# Patient Record
Sex: Female | Born: 1955
Health system: Southern US, Community
[De-identification: ages and names within clinical notes are randomized; demographics above are authoritative.]

## PROBLEM LIST (undated history)

## (undated) DIAGNOSIS — E785 Hyperlipidemia, unspecified: Secondary | ICD-10-CM

## (undated) DIAGNOSIS — K219 Gastro-esophageal reflux disease without esophagitis: Secondary | ICD-10-CM

## (undated) DIAGNOSIS — R002 Palpitations: Secondary | ICD-10-CM

## (undated) DIAGNOSIS — K829 Disease of gallbladder, unspecified: Secondary | ICD-10-CM

## (undated) DIAGNOSIS — Z86010 Personal history of colonic polyps: Secondary | ICD-10-CM

## (undated) DIAGNOSIS — M940 Chondrocostal junction syndrome [Tietze]: Secondary | ICD-10-CM

## (undated) DIAGNOSIS — I10 Essential (primary) hypertension: Secondary | ICD-10-CM

## (undated) DIAGNOSIS — Z9889 Other specified postprocedural states: Secondary | ICD-10-CM

## (undated) DIAGNOSIS — M2550 Pain in unspecified joint: Secondary | ICD-10-CM

## (undated) DIAGNOSIS — M722 Plantar fascial fibromatosis: Secondary | ICD-10-CM

## (undated) DIAGNOSIS — M549 Dorsalgia, unspecified: Secondary | ICD-10-CM

## (undated) DIAGNOSIS — M797 Fibromyalgia: Secondary | ICD-10-CM

## (undated) DIAGNOSIS — R600 Localized edema: Secondary | ICD-10-CM

## (undated) DIAGNOSIS — F419 Anxiety disorder, unspecified: Secondary | ICD-10-CM

## (undated) DIAGNOSIS — M5126 Other intervertebral disc displacement, lumbar region: Secondary | ICD-10-CM

## (undated) HISTORY — DX: Chondrocostal junction syndrome (tietze): M94.0

## (undated) HISTORY — DX: Disease of gallbladder, unspecified: K82.9

## (undated) HISTORY — PX: OTHER SURGICAL HISTORY: SHX169

## (undated) HISTORY — DX: Fibromyalgia: M79.7

## (undated) HISTORY — DX: Essential (primary) hypertension: I10

## (undated) HISTORY — DX: Anxiety disorder, unspecified: F41.9

## (undated) HISTORY — DX: Plantar fascial fibromatosis: M72.2

## (undated) HISTORY — DX: Palpitations: R00.2

## (undated) HISTORY — DX: Gastro-esophageal reflux disease without esophagitis: K21.9

## (undated) HISTORY — PX: CHOLECYSTECTOMY: SHX55

## (undated) HISTORY — DX: Hyperlipidemia, unspecified: E78.5

## (undated) HISTORY — DX: Pain in unspecified joint: M25.50

## (undated) HISTORY — DX: Localized edema: R60.0

## (undated) HISTORY — DX: Other intervertebral disc displacement, lumbar region: M51.26

## (undated) HISTORY — DX: Other specified postprocedural states: Z98.890

## (undated) HISTORY — DX: Personal history of colonic polyps: Z86.010

## (undated) HISTORY — DX: Dorsalgia, unspecified: M54.9

---

## 1986-06-18 HISTORY — PX: NASAL SINUS SURGERY: SHX719

## 1987-06-19 HISTORY — PX: CHOLECYSTECTOMY: SHX55

## 2001-06-18 HISTORY — PX: CERVICAL SPINE SURGERY: SHX589

## 2006-05-22 ENCOUNTER — Encounter: Admission: RE | Admit: 2006-05-22 | Discharge: 2006-05-22 | Payer: Self-pay | Admitting: Obstetrics and Gynecology

## 2009-02-17 ENCOUNTER — Encounter: Admission: RE | Admit: 2009-02-17 | Discharge: 2009-02-17 | Payer: Self-pay | Admitting: Obstetrics and Gynecology

## 2009-05-18 HISTORY — PX: LAPAROSCOPIC HYSTERECTOMY: SHX1926

## 2009-06-01 ENCOUNTER — Ambulatory Visit (HOSPITAL_COMMUNITY): Admission: RE | Admit: 2009-06-01 | Discharge: 2009-06-02 | Payer: Self-pay | Admitting: Obstetrics and Gynecology

## 2010-01-06 ENCOUNTER — Encounter: Admission: RE | Admit: 2010-01-06 | Discharge: 2010-01-06 | Payer: Self-pay | Admitting: Obstetrics and Gynecology

## 2010-06-23 ENCOUNTER — Encounter
Admission: RE | Admit: 2010-06-23 | Discharge: 2010-06-23 | Payer: Self-pay | Source: Home / Self Care | Attending: Obstetrics and Gynecology | Admitting: Obstetrics and Gynecology

## 2010-09-18 LAB — CBC
Platelets: 181 10*3/uL (ref 150–400)
RDW: 13.3 % (ref 11.5–15.5)

## 2010-09-19 LAB — BASIC METABOLIC PANEL
BUN: 14 mg/dL (ref 6–23)
Chloride: 107 mEq/L (ref 96–112)
GFR calc Af Amer: >60 mL/min (ref >60)
Glucose, Bld: 88 mg/dL (ref 70–99)
Potassium: 3.9 mEq/L (ref 3.5–5.1)
Sodium: 137 mEq/L (ref 135–145)

## 2010-09-19 LAB — CBC
HCT: 38.2 % (ref 36.0–46.0)
MCHC: 33.8 g/dL (ref 30.0–36.0)
MCV: 87.6 fL (ref 78.0–100.0)
Platelets: 203 10*3/uL (ref 150–400)
WBC: 5.9 10*3/uL (ref 4.0–10.5)

## 2011-05-04 ENCOUNTER — Other Ambulatory Visit: Payer: Self-pay | Admitting: Gastroenterology

## 2011-08-28 ENCOUNTER — Other Ambulatory Visit: Payer: Self-pay | Admitting: Obstetrics and Gynecology

## 2011-08-28 DIAGNOSIS — Z1231 Encounter for screening mammogram for malignant neoplasm of breast: Secondary | ICD-10-CM

## 2011-08-29 ENCOUNTER — Ambulatory Visit
Admission: RE | Admit: 2011-08-29 | Discharge: 2011-08-29 | Disposition: A | Discharge status: Home / Self Care | Payer: BC Managed Care – PPO | Source: Ambulatory Visit | Attending: Obstetrics and Gynecology | Admitting: Obstetrics and Gynecology

## 2011-08-29 DIAGNOSIS — Z1231 Encounter for screening mammogram for malignant neoplasm of breast: Secondary | ICD-10-CM

## 2012-08-25 ENCOUNTER — Other Ambulatory Visit: Payer: Self-pay

## 2012-08-25 DIAGNOSIS — Z1231 Encounter for screening mammogram for malignant neoplasm of breast: Secondary | ICD-10-CM

## 2012-09-18 ENCOUNTER — Ambulatory Visit
Admission: RE | Admit: 2012-09-18 | Discharge: 2012-09-18 | Disposition: A | Discharge status: Home / Self Care | Payer: BC Managed Care – PPO | Source: Ambulatory Visit

## 2012-09-18 ENCOUNTER — Ambulatory Visit: Payer: BC Managed Care – PPO

## 2012-09-18 DIAGNOSIS — Z1231 Encounter for screening mammogram for malignant neoplasm of breast: Secondary | ICD-10-CM

## 2012-09-25 ENCOUNTER — Ambulatory Visit: Payer: BC Managed Care – PPO

## 2013-10-27 ENCOUNTER — Other Ambulatory Visit: Payer: Self-pay

## 2013-10-27 DIAGNOSIS — Z1231 Encounter for screening mammogram for malignant neoplasm of breast: Secondary | ICD-10-CM

## 2013-10-28 ENCOUNTER — Ambulatory Visit
Admission: RE | Admit: 2013-10-28 | Discharge: 2013-10-28 | Disposition: A | Discharge status: Home / Self Care | Payer: BC Managed Care – PPO | Source: Ambulatory Visit

## 2013-10-28 ENCOUNTER — Encounter (INDEPENDENT_AMBULATORY_CARE_PROVIDER_SITE_OTHER): Payer: Self-pay

## 2013-10-28 DIAGNOSIS — Z1231 Encounter for screening mammogram for malignant neoplasm of breast: Secondary | ICD-10-CM

## 2017-01-31 DIAGNOSIS — K219 Gastro-esophageal reflux disease without esophagitis: Secondary | ICD-10-CM | POA: Insufficient documentation

## 2017-01-31 DIAGNOSIS — E785 Hyperlipidemia, unspecified: Secondary | ICD-10-CM | POA: Insufficient documentation

## 2017-01-31 DIAGNOSIS — I10 Essential (primary) hypertension: Secondary | ICD-10-CM | POA: Insufficient documentation

## 2017-03-06 ENCOUNTER — Encounter: Payer: Self-pay | Admitting: Gastroenterology

## 2017-03-13 ENCOUNTER — Encounter: Payer: Self-pay | Admitting: Gastroenterology

## 2017-03-25 ENCOUNTER — Ambulatory Visit: Payer: Self-pay | Admitting: Gastroenterology

## 2017-05-23 ENCOUNTER — Ambulatory Visit: Payer: Self-pay | Admitting: Gastroenterology

## 2017-08-07 ENCOUNTER — Ambulatory Visit: Payer: BLUE CROSS/BLUE SHIELD | Admitting: Gastroenterology

## 2017-08-07 ENCOUNTER — Encounter: Payer: Self-pay | Admitting: Gastroenterology

## 2017-08-07 VITALS — BP 130/76 | HR 88 | Ht 61.0 in | Wt 187.0 lb

## 2017-08-07 DIAGNOSIS — Z86010 Personal history of colonic polyps: Secondary | ICD-10-CM

## 2017-08-07 DIAGNOSIS — R194 Change in bowel habit: Secondary | ICD-10-CM

## 2017-08-07 DIAGNOSIS — R1013 Epigastric pain: Secondary | ICD-10-CM

## 2017-08-07 DIAGNOSIS — K219 Gastro-esophageal reflux disease without esophagitis: Secondary | ICD-10-CM | POA: Diagnosis not present

## 2017-08-07 MED ORDER — NA SULFATE-K SULFATE-MG SULF 17.5-3.13-1.6 GM/177ML PO SOLN: 1.0000 | Freq: Once | ORAL | 0 refills | Status: AC

## 2017-08-07 MED ORDER — METHYLCELLULOSE (LAXATIVE) 500 MG PO TABS: 1.0000 | ORAL_TABLET | Freq: Every day | ORAL | 0 refills | Status: DC

## 2017-08-07 MED ORDER — OMEPRAZOLE 40 MG PO CPDR: 40.0000 mg | DELAYED_RELEASE_CAPSULE | Freq: Two times a day (BID) | ORAL | 3 refills | Status: DC

## 2017-08-07 NOTE — Progress Notes (Signed)
HPI :  62 y/o female with a history of fibromyalgia, GERD, history of colon adenomas, self referred here for further evaluation of abdominal pain and GERD.  She endorses having reflux symptoms for a long time.  She states she has been on antacids for a long time for this.  She does not have much pyrosis but does endorse some regurgitation and epigastric discomfort are her reflux symptoms.  She states since August she has had some intermittent symptoms that bother her.  She was seen in urgent care at that time for an episode of chest pain, she was told this was not related to her heart.  She states over time she is developed some intermittent epigastric pain, she states this bothers her roughly 3-5 days/week.  The discomfort can last all day at times with some rare nocturnal symptoms.  She cannot think of any clear triggers which makes this worse.  Eating does not appear to cause symptoms. She also has some associated aching in her chest at times.  She denies any dysphasia or odynophagia.  She has rare nausea but no vomiting.  She states that when she drinks something cold that will make her feel better.  She states her pain can sometimes radiate into the middle of her back.  She states her symptoms are sometimes associated with her reflux symptoms.  She takes omeprazole 40 mg once a daily and reports using Tums a few times a week for breakthrough symptoms of reflux.  She states she has previously seen a cardiologist and had a stress test that was normal a few years ago.  She denies any exertional symptoms.  She otherwise reports having irregular bowel habits with episodes of loose stools followed by days of constipation.  She was previously on MiraLAX and then stopped.  She denies any blood in her stools.  She denies any weight loss.  Has a grandmother who had colon cancer but no other family history of colon cancer.  She had a colonoscopy in 2012 which showed one adenoma that was removed.  She has not  had a follow-up surveillance colonoscopy.  She thinks she has had an EGD several years ago.  She has a history of cholecystectomy around age 62.  Colonoscopy 05/04/2011 - sigmoid adenoma  Past Medical History:  Diagnosis Date  . Acid reflux   . Fibromyalgia   . H/O neck surgery   . Hx of colonic polyps   . Hyperlipidemia   . Hypertension      Past Surgical History:  Procedure Laterality Date  . CHOLECYSTECTOMY    . LAPAROSCOPIC HYSTERECTOMY  05/2009  . sinus surgeyr     Family History  Problem Relation Age of Onset  . Heart disease Mother   . Diabetes Mother   . Kidney disease Mother   . Leukemia Mother        CLL  . Heart attack Father        AGE 21  . Colon cancer Maternal Grandmother   . AAA (abdominal aortic aneurysm) Neg Hx    Social History   Tobacco Use  . Smoking status: Former Games developermoker  . Smokeless tobacco: Never Used  Substance Use Topics  . Alcohol use: No    Frequency: Never  . Drug use: No   Current Outpatient Medications  Medication Sig Dispense Refill  . amLODipine-benazepril (LOTREL) 10-20 MG capsule Take 1 capsule by mouth daily.    . chlorhexidine (PERIDEX) 0.12 % solution Use as directed 15 mLs  in the mouth or throat 2 (two) times daily.    Marland Kitchen estradiol (ESTRACE) 1 MG tablet Take 1 mg by mouth daily.    Marland Kitchen lovastatin (MEVACOR) 40 MG tablet Take 40 mg by mouth at bedtime.    Marland Kitchen omeprazole (PRILOSEC) 40 MG capsule Take 40 mg by mouth daily.     No current facility-administered medications for this visit.    Allergies  Allergen Reactions  . Hydrocodone Nausea Only  . Ultram [Tramadol Hcl] Nausea Only    Labs from primary care reviewed - done 01/31/2017 - normal LFTs, lipase, renal function  Review of Systems: All systems reviewed and negative except where noted in HPI.   Physical Exam: BP 130/76   Pulse 88   Ht 5\' 1"  (1.549 m)   Wt 187 lb (84.8 kg)   BMI 35.33 kg/m  Constitutional: Pleasant,well-developed, female in no acute  distress. HEENT: Normocephalic and atraumatic. Conjunctivae are normal. No scleral icterus. Neck supple.  Cardiovascular: Normal rate, regular rhythm.  Pulmonary/chest: Effort normal and breath sounds normal. No wheezing, rales or rhonchi. Abdominal: Soft, nondistended, nontender. There are no masses palpable. No hepatomegaly. Extremities: no edema Lymphadenopathy: No cervical adenopathy noted. Neurological: Alert and oriented to person place and time. Skin: Skin is warm and dry. No rashes noted. Psychiatric: Normal mood and affect. Behavior is normal.   ASSESSMENT AND PLAN: 62 year old female with history of fibromyalgia and reflux, here for new patient assessment for epigastric pain and altered bowel habits.  Epigastric pain / GERD / atypical chest pain - possible her recent pains are related to reflux, as it appears to be associated with her worsening reflux symptoms, she is using TUMS multiple days per week for breakthrough.  I am recommending we increase her omeprazole to twice daily dosing for a few weeks to see if this helps.  Otherwise recommending an upper endoscopy to further evaluate these symptoms and screen for BE.  I discussed the risks and benefits of endoscopy with her and she wanted to proceed, although she is awaiting for preapproval from her insurance to see how much she will have to pay out of pocket for this.  Cardiac etiology seems less likely, she states she has had a recent negative workup.  If EGD is unremarkable and trial of higher dose omeprazole does not improve her symptoms of epigastric pain, recommend CT scan of the abdomen and pelvis. She agreed.  Altered bowel habits / history of colon adenoma - alternating constipation and diarrhea, recommend a trial of Citrucel once daily to help normalize her bowels.  She has a history of colon adenoma and is overdue for surveillance colonoscopy.  I am recommending a colonoscopy for surveillance of her polyps.  I discussed the  risks and benefits of this and she did want to proceed, however she again is concerned about cost and needs preapproval from insurance.  Ileene Patrick, MD [facility] Pager [phone]

## 2017-08-07 NOTE — Patient Instructions (Addendum)
If you are age 61 or older, your body mass index should be between 23-30. Your Body mass index is 35.33 kg/m. If this is out of the aforementioned range listed, please consider follow up with your Primary Care Provider.  If you are age 61 or younger, your body mass index should be between 19-25. Your Body mass index is 35.33 kg/m. If this is out of the aformentioned range listed, please consider follow up with your Primary Care Provider.   You have been scheduled for an endoscopy and colonoscopy. Please follow the written instructions given to you at your visit today. Please pick up your prep supplies at the pharmacy within the next 1-3 days. If you use inhalers (even only as needed), please bring them with you on the day of your procedure. Your physician has requested that you go to www.startemmi.com and enter the access code given to you at your visit today. This web site gives a general overview about your procedure. However, you should still follow specific instructions given to you by our office regarding your preparation for the procedure.  Please purchase Citrucel over the counter and take daily.  Please increase your Omeprazole to twice a day.  We have sent the following medications to your pharmacy for you to pick up at your convenience: Omeprazole, 40mg   Thank you for entrusting me with your care and for choosing [facility], Dr. Ileene PatrickSteven Armbruster

## 2017-08-13 ENCOUNTER — Telehealth: Payer: Self-pay | Admitting: Gastroenterology

## 2017-08-13 NOTE — Telephone Encounter (Signed)
Patient states that since doubling the omeprazole she has started waking up in the middle of the night with her "heart racing", she does say that the "knot" in her upper stomach has felt better. She also has an appointment with her PCP for tomorrow to discuss anxiety issues. Please advise.

## 2017-08-13 NOTE — Telephone Encounter (Signed)
Palpitations would be an unusual side effect from omeprazole, and sounds like it has helped her symptoms which could be due to reflux.   That being said If she thinks there is a relationship between her higher dosing and these symptoms, she can reduce back to her normal omeprazole dosing and see if the symptoms go away. She should mention this to her PCP tomorrow as well. Thanks

## 2017-08-13 NOTE — Telephone Encounter (Signed)
Spoke to patient, she will reduce the dosage back down and let her PCP know. Told her to let us know if not better.

## 2017-08-15 ENCOUNTER — Telehealth: Payer: Self-pay

## 2017-08-15 NOTE — Telephone Encounter (Signed)
Medical records sent [facility] [place] [facility]  Referring MD: Farris HasAaron Morrow, MD Phone #: [phone]  Notes sent to scheduling

## 2017-08-19 ENCOUNTER — Encounter: Payer: Self-pay | Admitting: Cardiology

## 2017-08-19 ENCOUNTER — Ambulatory Visit: Payer: BLUE CROSS/BLUE SHIELD | Admitting: Cardiology

## 2017-08-19 VITALS — BP 118/70 | HR 84 | Ht 61.0 in | Wt 179.8 lb

## 2017-08-19 DIAGNOSIS — I10 Essential (primary) hypertension: Secondary | ICD-10-CM | POA: Diagnosis not present

## 2017-08-19 DIAGNOSIS — E782 Mixed hyperlipidemia: Secondary | ICD-10-CM

## 2017-08-19 DIAGNOSIS — K219 Gastro-esophageal reflux disease without esophagitis: Secondary | ICD-10-CM | POA: Diagnosis not present

## 2017-08-19 DIAGNOSIS — R0789 Other chest pain: Secondary | ICD-10-CM | POA: Diagnosis not present

## 2017-08-19 NOTE — Patient Instructions (Signed)
Medication Instructions:  Your physician recommends that you continue on your current medications as directed. Please refer to the Current Medication list given to you today.  Labwork: None  Testing/Procedures: Your physician has requested that you have a stress echocardiogram. For further information please visit www.[email or web address]. Please follow instruction sheet as given.  Follow-Up: Your physician recommends that you schedule a follow-up appointment in: 3 months  Any Other Special Instructions Will Be Listed Below (If Applicable).     If you need a refill on your cardiac medications before your next appointment, please call your pharmacy.   [facility]  Leeana Creer A, RN, BSN  

## 2017-08-19 NOTE — Progress Notes (Signed)
Cardiology Office Note:    Date:  08/19/2017   ID:  Hannah Wiggins, DOB 04/07/1956, MRN [id number]  PCP:  Farris HasMorrow, Aaron, MD  Cardiologist:  Garwin Brothersajan R Zowie Lundahl, MD   Referring MD: Farris HasMorrow, Aaron, MD    ASSESSMENT:    1. Atypical chest pain   2. Benign hypertension   3. Gastroesophageal reflux disease, esophagitis presence not specified   4. Mixed hyperlipidemia    PLAN:    In order of problems listed above:  1. Primary prevention stressed with the patient.  Importance of compliance with diet and medications stressed.  Her blood pressure is stable and lipids are managed by her primary care doctor.  Her lipids are elevated in the past and diet was discussed with her.  Questions were answered to her satisfaction. 2. Chest pain appears atypical.  However in view of multiple risk factors and to reassure her I will obtain a exercise stress echo.  She also has significant gastroesophageal reflux disease and is managed by her primary care doctor and gastroenterologist.  She knows to go to the nearest emergency room for any concerning symptoms.   Medication Adjustments/Labs and Tests Ordered: Current medicines are reviewed at length with the patient today.  Concerns regarding medicines are outlined above.  No orders of the defined types were placed in this encounter.  No orders of the defined types were placed in this encounter.    History of Present Illness:    Hannah Wiggins is a 62 y.o. female who is being seen today for the evaluation of chest pain at the request of Farris HasMorrow, Aaron, MD.  Patient is a pleasant 62 year old female.  She has past medical history of essential hypertension and dyslipidemia.  She mentions to me that over the past several weeks she does have experiencing substernal chest tightness.  No orthopnea or PND.  This does not radiate to any part of the body.  She has been getting a sedentary lifestyle for the past couple of months.  She is married and is sexually  active.  Sexual activity does not bring about the symptoms.  At the time of my evaluation, the patient is alert awake oriented and in no distress.  Past Medical History:  Diagnosis Date  . Acid reflux   . Fibromyalgia   . H/O neck surgery   . Hx of colonic polyps   . Hyperlipidemia   . Hypertension     Past Surgical History:  Procedure Laterality Date  . CHOLECYSTECTOMY    . LAPAROSCOPIC HYSTERECTOMY  05/2009  . sinus surgeyr      Current Medications: Current Meds  Medication Sig  . amLODipine-benazepril (LOTREL) 10-20 MG capsule Take 1 capsule by mouth daily.  . chlorhexidine (PERIDEX) 0.12 % solution Use as directed 15 mLs in the mouth or throat 2 (two) times daily.  . cyclobenzaprine (FLEXERIL) 10 MG tablet   . estradiol (ESTRACE) 1 MG tablet Take 1 mg by mouth daily.  Marland Kitchen. lovastatin (MEVACOR) 40 MG tablet Take 40 mg by mouth at bedtime.  . Methylcellulose, Laxative, (CITRUCEL) 500 MG TABS Take 1 tablet (500 mg total) by mouth daily.  Marland Kitchen. omeprazole (PRILOSEC) 20 MG capsule   . omeprazole (PRILOSEC) 40 MG capsule Take 1 capsule (40 mg total) by mouth 2 (two) times daily.     Allergies:   Hydrocodone and Ultram [tramadol hcl]   Social History   Socioeconomic History  . Marital status: Married    Spouse name: None  . Number of  children: 1  . Years of education: None  . Highest education level: None  Social Needs  . Financial resource strain: None  . Food insecurity - worry: None  . Food insecurity - inability: None  . Transportation needs - medical: None  . Transportation needs - non-medical: None  Occupational History  . None  Tobacco Use  . Smoking status: Former Games developer  . Smokeless tobacco: Never Used  Substance and Sexual Activity  . Alcohol use: No    Frequency: Never  . Drug use: No  . Sexual activity: None  Other Topics Concern  . None  Social History Narrative  . None     Family History: The patient's family history includes Colon cancer in her  maternal grandmother; Diabetes in her mother; Heart attack in her father; Heart disease in her mother; Kidney disease in her mother; Leukemia in her mother. There is no history of AAA (abdominal aortic aneurysm).  ROS:   Please see the history of present illness.    All other systems reviewed and are negative.  EKGs/Labs/Other Studies Reviewed:    The following studies were reviewed today: I reviewed EKG which reveals sinus rhythm and nonspecific ST-T changes.   Recent Labs: No results found for requested labs within last 8760 hours.  Recent Lipid Panel No results found for: CHOL, TRIG, HDL, CHOLHDL, VLDL, LDLCALC, LDLDIRECT  Physical Exam:    VS:  BP 118/70 (BP Location: Left Arm, Patient Position: Sitting, Cuff Size: Normal)   Pulse 84   Ht 5\' 1"  (1.549 m)   Wt 179 lb 12.8 oz (81.6 kg)   SpO2 99%   BMI 33.97 kg/m     Wt Readings from Last 3 Encounters:  08/19/17 179 lb 12.8 oz (81.6 kg)  08/07/17 187 lb (84.8 kg)     GEN: Patient is in no acute distress HEENT: Normal NECK: No JVD; No carotid bruits LYMPHATICS: No lymphadenopathy CARDIAC: S1 S2 regular, 2/6 systolic murmur at the apex. RESPIRATORY:  Clear to auscultation without rales, wheezing or rhonchi  ABDOMEN: Soft, non-tender, non-distended MUSCULOSKELETAL:  No edema; No deformity  SKIN: Warm and dry NEUROLOGIC:  Alert and oriented x 3 PSYCHIATRIC:  Normal affect    Signed, Garwin Brothers, MD  08/19/2017 10:20 AM    [facility]

## 2017-08-30 ENCOUNTER — Telehealth (HOSPITAL_COMMUNITY): Payer: Self-pay | Admitting: *Deleted

## 2017-08-30 NOTE — Telephone Encounter (Signed)
Left message on voicemail per DPR in reference to upcoming appointment scheduled on 09/04/17 at 2:30 with detailed instructions given per Stress Test Requisition Sheet for the test. LM to arrive 30 minutes early, and that it is imperative to arrive on time for appointment to keep from having the test rescheduled. If you need to cancel or reschedule your appointment, please call the office within 24 hours of your appointment. Failure to do so may result in a cancellation of your appointment, and a $50 no show fee. Phone number given for call back for any questions. Daneil DolinSharon S Brooks

## 2017-09-02 ENCOUNTER — Encounter: Payer: Self-pay | Admitting: Gastroenterology

## 2017-09-04 ENCOUNTER — Ambulatory Visit (HOSPITAL_BASED_OUTPATIENT_CLINIC_OR_DEPARTMENT_OTHER): Payer: BLUE CROSS/BLUE SHIELD

## 2017-09-04 ENCOUNTER — Ambulatory Visit (HOSPITAL_COMMUNITY): Payer: BLUE CROSS/BLUE SHIELD | Attending: Cardiology

## 2017-09-04 DIAGNOSIS — R5383 Other fatigue: Secondary | ICD-10-CM | POA: Diagnosis not present

## 2017-09-04 DIAGNOSIS — I10 Essential (primary) hypertension: Secondary | ICD-10-CM | POA: Insufficient documentation

## 2017-09-04 DIAGNOSIS — R0789 Other chest pain: Secondary | ICD-10-CM

## 2017-09-04 DIAGNOSIS — E785 Hyperlipidemia, unspecified: Secondary | ICD-10-CM | POA: Diagnosis not present

## 2017-09-06 ENCOUNTER — Telehealth: Payer: Self-pay | Admitting: Cardiology

## 2017-09-06 NOTE — Telephone Encounter (Signed)
Patient would like results please.

## 2017-09-06 NOTE — Telephone Encounter (Signed)
Informed patient of results

## 2017-09-13 ENCOUNTER — Other Ambulatory Visit (HOSPITAL_BASED_OUTPATIENT_CLINIC_OR_DEPARTMENT_OTHER): Payer: BLUE CROSS/BLUE SHIELD

## 2017-09-16 ENCOUNTER — Encounter: Payer: BLUE CROSS/BLUE SHIELD | Admitting: Gastroenterology

## 2017-09-23 ENCOUNTER — Ambulatory Visit: Payer: BLUE CROSS/BLUE SHIELD | Admitting: Cardiology

## 2018-02-04 ENCOUNTER — Other Ambulatory Visit: Payer: Self-pay | Admitting: Family Medicine

## 2018-02-04 DIAGNOSIS — M5410 Radiculopathy, site unspecified: Secondary | ICD-10-CM

## 2018-03-10 ENCOUNTER — Ambulatory Visit: Payer: BLUE CROSS/BLUE SHIELD | Admitting: Sports Medicine

## 2018-03-10 ENCOUNTER — Encounter: Payer: Self-pay | Admitting: Sports Medicine

## 2018-03-10 VITALS — BP 120/73 | Ht 61.0 in | Wt 196.0 lb

## 2018-03-10 DIAGNOSIS — M722 Plantar fascial fibromatosis: Secondary | ICD-10-CM

## 2018-03-10 NOTE — Progress Notes (Signed)
   Subjective:    Patient ID: Hannah SealKaren W Belter, female    DOB: Oct 11, 1955, 62 y.o.   MRN: [id number]  HPI chief complaint: Right heel pain  Very pleasant 62 year old female comes in today complaining of 3 months of right heel pain.  Symptoms began shortly after she had to do an exercise stress test.  Pain was initially along the medial calcaneus but it is now more diffuse.  It is worse with first step but it can persist throughout the day as well.  She takes 800 mg of ibuprofen as needed which does seem to be helpful.  She has tried some soft off-the-shelf orthotics which have not been very helpful.  She does have a history of plantar fasciitis treated several years ago with custom orthotics which were curative.  She has been icing daily but it is uncomfortable.  She is also been trying some intermittent stretching but is not doing anything scheduled.  No numbness or tingling.  Past medical history reviewed Medications reviewed She is reviewed    Review of Systems    As above Objective:   Physical Exam  Well-developed, well-nourished.  No acute distress.  Awake alert and oriented x3.  Vital signs reviewed.  Right heel: Tenderness to palpation at the origin of the plantar fascia.  Negative calcaneal squeeze.  No soft tissue swelling.  Moderate pes planus with standing.  Good pulses.  Walking with an obvious limp.  MSK ultrasound of the right calcaneus was performed.  Plantar fascia is thickened with hypoechoic changes.  No obvious stress fracture.  No calcaneal spurring.  Findings consistent with plantar fasciitis.      Assessment & Plan:   Right heel pain secondary to plantar fasciitis  Custom orthotics were created for the patient today.  In addition, she may continue with her 800 mg of ibuprofen as needed but she is cautioned about stomach upset as she does have a history of reflux disease.  Also provided her information regarding a [place] sock to wear at night.  She will  start daily plantar fascial stretching and calf stretching. D/C daily icing since it is painful.  We will also try an arch strap.  Follow-up with me as needed.  If symptoms persist or worsen, consider x-ray.  Total time spent with the patient was 40 minutes with greater than 50% of the time spent in face-to-face consultation discussing her diagnosis, treatment, and fitting her with custom orthotics.  Patient found her orthotics to be comfortable prior to leaving the office.  Patient was fitted for a : standard, cushioned, semi-rigid orthotic. The orthotic was heated and afterward the patient stood on the orthotic blank positioned on the orthotic stand. The patient was positioned in subtalar neutral position and 10 degrees of ankle dorsiflexion in a weight bearing stance. After completion of molding, a stable base was applied to the orthotic blank. The blank was ground to a stable position for weight bearing. Size: 7 Base: Blue EVA Posting: none Additional orthotic padding: none

## 2018-03-26 ENCOUNTER — Ambulatory Visit: Payer: BLUE CROSS/BLUE SHIELD | Admitting: Sports Medicine

## 2018-04-15 ENCOUNTER — Telehealth: Payer: Self-pay

## 2018-04-15 DIAGNOSIS — M722 Plantar fascial fibromatosis: Secondary | ICD-10-CM

## 2018-04-15 NOTE — Telephone Encounter (Signed)
Per Dr. Margaretha Sheffield, right calcaneal x-ray order placed. Pt understands and will go get the test done at her earliest convenience.

## 2018-04-16 ENCOUNTER — Ambulatory Visit
Admission: RE | Admit: 2018-04-16 | Discharge: 2018-04-16 | Disposition: A | Discharge status: Home / Self Care | Payer: BLUE CROSS/BLUE SHIELD | Source: Ambulatory Visit | Attending: Sports Medicine | Admitting: Sports Medicine

## 2018-04-16 DIAGNOSIS — M722 Plantar fascial fibromatosis: Secondary | ICD-10-CM

## 2018-04-21 ENCOUNTER — Telehealth: Payer: Self-pay

## 2018-04-21 DIAGNOSIS — M722 Plantar fascial fibromatosis: Secondary | ICD-10-CM

## 2018-04-21 NOTE — Telephone Encounter (Signed)
Pt agrees with plan. Referral placed to Dr. Logan Bores.

## 2018-04-30 ENCOUNTER — Ambulatory Visit: Payer: Self-pay | Admitting: Podiatry

## 2019-08-11 ENCOUNTER — Ambulatory Visit (INDEPENDENT_AMBULATORY_CARE_PROVIDER_SITE_OTHER): Payer: BLUE CROSS/BLUE SHIELD | Admitting: Family Medicine

## 2019-08-12 ENCOUNTER — Ambulatory Visit (INDEPENDENT_AMBULATORY_CARE_PROVIDER_SITE_OTHER): Payer: 59 | Admitting: Bariatrics

## 2019-08-12 ENCOUNTER — Other Ambulatory Visit: Payer: Self-pay

## 2019-08-12 ENCOUNTER — Encounter (INDEPENDENT_AMBULATORY_CARE_PROVIDER_SITE_OTHER): Payer: Self-pay | Admitting: Bariatrics

## 2019-08-12 VITALS — BP 111/75 | HR 71 | Temp 98.4°F | Ht 61.0 in | Wt 211.0 lb

## 2019-08-12 DIAGNOSIS — Z1331 Encounter for screening for depression: Secondary | ICD-10-CM

## 2019-08-12 DIAGNOSIS — E7849 Other hyperlipidemia: Secondary | ICD-10-CM | POA: Diagnosis not present

## 2019-08-12 DIAGNOSIS — Z9189 Other specified personal risk factors, not elsewhere classified: Secondary | ICD-10-CM | POA: Diagnosis not present

## 2019-08-12 DIAGNOSIS — R5383 Other fatigue: Secondary | ICD-10-CM | POA: Diagnosis not present

## 2019-08-12 DIAGNOSIS — E66812 Obesity, class 2: Secondary | ICD-10-CM

## 2019-08-12 DIAGNOSIS — I10 Essential (primary) hypertension: Secondary | ICD-10-CM

## 2019-08-12 DIAGNOSIS — E559 Vitamin D deficiency, unspecified: Secondary | ICD-10-CM

## 2019-08-12 DIAGNOSIS — Z0289 Encounter for other administrative examinations: Secondary | ICD-10-CM

## 2019-08-12 DIAGNOSIS — R0602 Shortness of breath: Secondary | ICD-10-CM | POA: Diagnosis not present

## 2019-08-12 DIAGNOSIS — Z6841 Body Mass Index (BMI) 40.0 and over, adult: Secondary | ICD-10-CM

## 2019-08-12 DIAGNOSIS — K219 Gastro-esophageal reflux disease without esophagitis: Secondary | ICD-10-CM

## 2019-08-12 DIAGNOSIS — R7309 Other abnormal glucose: Secondary | ICD-10-CM

## 2019-08-12 NOTE — Progress Notes (Signed)
Chief Complaint:   OBESITY Hannah Wiggins (MR# [id number]) is a 64 y.o. female who presents for evaluation and treatment of obesity and related comorbidities. Current BMI is Body mass index is 39.87 kg/m.Hannah Wiggins Hannah Wiggins has been struggling with her weight for many years and has been unsuccessful in either losing weight, maintaining weight loss, or reaching her healthy weight goal.  Hannah Wiggins is currently in the action stage of change and ready to dedicate time achieving and maintaining a healthier weight. Hannah Wiggins is interested in becoming our patient and working on intensive lifestyle modifications including (but not limited to) diet and exercise for weight loss.  Hannah Wiggins does not like to cook due to cleanup. She craves breads and sweets. She does not like berries.  Hannah Wiggins's habits were reviewed today and are as follows: Her family eats meals together, she thinks her family will eat healthier with her, her desired weight loss is 61 lbs, she started gaining weight at 85-84 years old, her heaviest weight ever was 211 pounds, she craves breads and sweets, she snacks frequently in the evenings, she frequently makes poor food choices, she frequently eats larger portions than normal, she has binge eating behaviors and she struggles with emotional eating.  Depression Screen Hannah Wiggins's Food and Mood (modified PHQ-9) score was 10.  Depression screen PHQ 2/9 08/12/2019  Decreased Interest 1  Down, Depressed, Hopeless 1  PHQ - 2 Score 2  Altered sleeping 1  Tired, decreased energy 3  Change in appetite 2  Feeling bad or failure about yourself  2  Trouble concentrating 0  Moving slowly or fidgety/restless 0  Suicidal thoughts 0  PHQ-9 Score 10  Difficult doing work/chores Not difficult at all   Subjective:   Other fatigue.  Hannah Wiggins denies daytime somnolence and admits to waking up still tired. Hannah Wiggins generally gets 8-9 hours of sleep per night, and states that she does not sleep well most nights. Snoring is  present. Apneic episodes are not present. Epworth Sleepiness Score is 3.  Shortness of breath on exertion. Hannah Wiggins notes increasing shortness of breath with certain activities and seems to be worsening over time with weight gain. She notes getting out of breath sooner with activity than she used to. This has gotten worse recently. Hannah Wiggins denies shortness of breath at rest or orthopnea.  Essential hypertension. Hannah Wiggins is taking Lotrel. Blood pressure is well controlled.  BP Readings from Last 3 Encounters:  08/12/19 111/75  03/10/18 120/73  08/19/17 118/70   Lab Results  Component Value Date   CREATININE 0.67 05/31/2009   Other hyperlipidemia. Hannah Wiggins is taking lovastatin. She denies muscle aches related to medication.   No results found for: CHOL, HDL, LDLCALC, LDLDIRECT, TRIG, CHOLHDL No results found for: ALT, AST, GGT, ALKPHOS, BILITOT The 10-year ASCVD risk score Denman George DC Jr., et al., 2013) is: 3.8%   Values used to calculate the score:     Age: 39 years     Sex: Female     Is Non-Hispanic African American: No     Diabetic: No     Tobacco smoker: No     Systolic Blood Pressure: 111 mmHg     Is BP treated: Yes     HDL Cholesterol: 56 MG/DL     Total Cholesterol: 181 MG/DL  Gastroesophageal reflux disease, unspecified whether esophagitis present. Lachrista is taking Prilosec. GERD is not well controlled.  Vitamin D deficiency. Hannah Wiggins is taking Vitamin D 4,000 IU. Last Vitamin D level reportedly 29.  Elevated glucose.  Hannah Wiggins reports increased appetite. Last glucose reportedly 102.  Depression screening. [name] had a moderately positive depression screen with a PHQ-9 score of 10.  At risk for heart disease. Hannah Wiggins is at a higher than average risk for cardiovascular disease due to hypertension and obesity.  Assessment/Plan:   Other fatigue. Hannah Wiggins does feel that her weight is causing her energy to be lower than it should be. Fatigue may be related to obesity, depression or many other  causes. Labs will be ordered, and in the meanwhile, Hannah Wiggins will focus on self care including making healthy food choices, increasing physical activity and focusing on stress reduction.  EKG 12-Lead  Shortness of breath on exertion. Hannah Wiggins does feel that she gets out of breath more easily that she used to when she exercises. Hannah Wiggins's shortness of breath appears to be obesity related and exercise induced. She has agreed to work on weight loss and gradually increase exercise to treat her exercise induced shortness of breath. Will continue to monitor closely.  Essential hypertension. Hannah Wiggins is working on healthy weight loss and exercise to improve blood pressure control. We will watch for signs of hypotension as she continues her lifestyle modifications. She will continue medications as directed.  Other hyperlipidemia. Cardiovascular risk and specific lipid/LDL goals reviewed.  We discussed several lifestyle modifications today and Hannah Wiggins will continue to work on diet, exercise and weight loss efforts. Orders and follow up as documented in patient record. She will continue medications as directed.  Counseling Intensive lifestyle modifications are the first line treatment for this issue. . Dietary changes: Increase soluble fiber. Decrease simple carbohydrates. . Exercise changes: Moderate to vigorous-intensity aerobic activity 150 minutes per week if tolerated. . Lipid-lowering medications: see documented in medical record.  Gastroesophageal reflux disease, unspecified whether esophagitis present. Intensive lifestyle modifications are the first line treatment for this issue. We discussed several lifestyle modifications today and she will continue to work on diet, exercise and weight loss efforts. Orders and follow up as documented in patient record. Hannah Wiggins will continue medications as directed and will avoid triggers.  Counseling . If a person has gastroesophageal reflux disease (GERD), food and stomach acid  move back up into the esophagus and cause symptoms or problems such as damage to the esophagus. . Anti-reflux measures include: raising the head of the bed, avoiding tight clothing or belts, avoiding eating late at night, not lying down shortly after mealtime, and achieving weight loss. . Avoid ASA, NSAID's, caffeine, alcohol, and tobacco.  . OTC Pepcid and/or Tums are often very helpful for as needed use.  Hannah Wiggins However, for persisting chronic or daily symptoms, stronger medications like Omeprazole may be needed. . You may need to avoid foods and drinks such as: ? Coffee and tea (with or without caffeine). ? Drinks that contain alcohol. ? Energy drinks and sports drinks. ? Bubbly (carbonated) drinks or sodas. ? Chocolate and cocoa. ? Peppermint and mint flavorings. ? Garlic and onions. ? Horseradish. ? Spicy and acidic foods. These include peppers, chili powder, curry powder, vinegar, hot sauces, and BBQ sauce. ? Citrus fruit juices and citrus fruits, such as oranges, lemons, and limes. ? Tomato-based foods. These include red sauce, chili, salsa, and pizza with red sauce. ? Fried and fatty foods. These include donuts, french fries, potato chips, and high-fat dressings. ? High-fat meats. These include hot dogs, rib eye steak, sausage, ham, and bacon.  Vitamin D deficiency. Low Vitamin D level contributes to fatigue and are associated with obesity, breast, and colon cancer.  VITAMIN D 25 Hydroxy (Vit-D Deficiency, Fractures) level ordered.  Elevated glucose. Fasting labs will be obtained and results with be discussed with Clydie Braun in 2 weeks at her follow up visit. In the meanwhile Laquitta was started on a lower simple carbohydrate diet and will work on weight loss efforts. Hemoglobin A1c, Insulin, random labs ordered.  Depression screening. Jenniferann had a positive depression screening. Depression is commonly associated with obesity and often results in emotional eating behaviors. We will monitor this  closely and work on CBT to help improve the non-hunger eating patterns. Referral to Psychology may be required if no improvement is seen as she continues in our clinic.  At risk for heart disease. Stefanie was given approximately 15 minutes of coronary artery disease prevention counseling today. She is 64 y.o. female and has risk factors for heart disease including obesity. We discussed intensive lifestyle modifications today with an emphasis on specific weight loss instructions and strategies.   Repetitive spaced learning was employed today to elicit superior memory formation and behavioral change.  Class 3 severe obesity with serious comorbidity and body mass index (BMI) of 40.0 to 44.9 in adult, unspecified obesity type (HCC).  Briteny is currently in the action stage of change and her goal is to continue with weight loss efforts. I recommend Edell begin the structured treatment plan as follows:  She has agreed to the Category 1 Plan + 100 calories.  She will work on meal planning, intentional eating, and will work on portion size.  We reviewed labs from 05/09/2019 from [facility] with the patient including Vitamin D, chemistries, and CBC.  Exercise goals: All adults should avoid inactivity. Some physical activity is better than none, and adults who participate in any amount of physical activity gain some health benefits.   Behavioral modification strategies: increasing lean protein intake, decreasing simple carbohydrates, increasing vegetables, increasing water intake, decreasing eating out, no skipping meals, meal planning and cooking strategies, keeping healthy foods in the home, better snacking choices, emotional eating strategies and planning for success.  She was informed of the importance of frequent follow-up visits to maximize her success with intensive lifestyle modifications for her multiple health conditions. She was informed we would discuss her lab results at her next visit unless  there is a critical issue that needs to be addressed sooner. Jeannett agreed to keep her next visit at the agreed upon time to discuss these results.  Objective:   Blood pressure 111/75, pulse 71, temperature 98.4 F (36.9 C), temperature source Oral, height 5\' 1"  (1.549 m), weight 211 lb (95.7 kg), SpO2 95 %. Body mass index is 39.87 kg/m.  EKG: Sinus  Rhythm with a rate of 73 BPM. Poor R-wave progression - may be secondary to pulmonary disease - consider old anterior infarct. Low voltage - possible pulmonary disease. Abnormal.  Indirect Calorimeter completed today shows a VO2 of 212 and a REE of 1477.  Her calculated basal metabolic rate is thus her basal metabolic rate is better than expected.  General: Cooperative, alert, well developed, in no acute distress. HEENT: Conjunctivae and lids unremarkable. Cardiovascular: Regular rhythm.  Lungs: Normal work of breathing. Neurologic: No focal deficits.   Lab Results  Component Value Date   CREATININE 0.67 05/31/2009   BUN 14 05/31/2009   NA 137 05/31/2009   K 3.9 05/31/2009   CL 107 05/31/2009   CO2 25 05/31/2009   No results found for: ALT, AST, GGT, ALKPHOS, BILITOT No results found for: HGBA1C No results  found for: INSULIN No results found for: TSH No results found for: CHOL, HDL, LDLCALC, LDLDIRECT, TRIG, CHOLHDL Lab Results  Component Value Date   WBC 12.6 (H) 06/02/2009   HGB 10.9 (L) 06/02/2009   HCT 31.9 (L) 06/02/2009   MCV 88.0 06/02/2009   PLT 181 06/02/2009   No results found for: IRON, TIBC, FERRITIN  Attestation Statements:   Reviewed by clinician on day of visit: allergies, medications, problem list, medical history, surgical history, family history, social history, and previous encounter notes.  Migdalia Dk, am acting as Location manager for [employer], DO   I have reviewed the above documentation for accuracy and completeness, and I agree with the above. Jearld Lesch, DO

## 2019-08-13 ENCOUNTER — Encounter (INDEPENDENT_AMBULATORY_CARE_PROVIDER_SITE_OTHER): Payer: Self-pay | Admitting: Bariatrics

## 2019-08-13 DIAGNOSIS — E559 Vitamin D deficiency, unspecified: Secondary | ICD-10-CM | POA: Insufficient documentation

## 2019-08-13 LAB — INSULIN, RANDOM: INSULIN: 22.1 u[IU]/mL (ref 2.6–24.9)

## 2019-08-13 LAB — VITAMIN D 25 HYDROXY (VIT D DEFICIENCY, FRACTURES): Vit D, 25-Hydroxy: 27.2 ng/mL — ABNORMAL LOW (ref 30.0–100.0)

## 2019-08-13 LAB — HEMOGLOBIN A1C
Est. average glucose Bld gHb Est-mCnc: 108 mg/dL
Hgb A1c MFr Bld: 5.4 % (ref 4.8–5.6)

## 2019-08-26 ENCOUNTER — Other Ambulatory Visit: Payer: Self-pay

## 2019-08-26 ENCOUNTER — Encounter (INDEPENDENT_AMBULATORY_CARE_PROVIDER_SITE_OTHER): Payer: Self-pay | Admitting: Bariatrics

## 2019-08-26 ENCOUNTER — Ambulatory Visit (INDEPENDENT_AMBULATORY_CARE_PROVIDER_SITE_OTHER): Payer: 59 | Admitting: Bariatrics

## 2019-08-26 VITALS — BP 115/74 | HR 65 | Temp 98.1°F | Ht 61.0 in | Wt 199.0 lb

## 2019-08-26 DIAGNOSIS — Z9189 Other specified personal risk factors, not elsewhere classified: Secondary | ICD-10-CM | POA: Diagnosis not present

## 2019-08-26 DIAGNOSIS — E8881 Metabolic syndrome: Secondary | ICD-10-CM | POA: Diagnosis not present

## 2019-08-26 DIAGNOSIS — Z6837 Body mass index (BMI) 37.0-37.9, adult: Secondary | ICD-10-CM

## 2019-08-26 DIAGNOSIS — E559 Vitamin D deficiency, unspecified: Secondary | ICD-10-CM

## 2019-08-26 MED ORDER — VITAMIN D (ERGOCALCIFEROL) 1.25 MG (50000 UNIT) PO CAPS: 50000.0000 [IU] | ORAL_CAPSULE | ORAL | 0 refills | Status: DC

## 2019-08-26 NOTE — Progress Notes (Signed)
Chief Complaint:   OBESITY Hannah Wiggins is here to discuss her progress with her obesity treatment plan along with follow-up of her obesity related diagnoses. Hannah Wiggins is on the Category 1 Plan + 100 calories and states she is following her eating plan approximately 90% of the time. Hannah Wiggins states she is exercising 0 minutes 0 times per week.  Today's visit was #: 2 Starting weight: 211 lbs Starting date: 08/12/2019 Today's weight: 199 lbs Today's date: 08/26/2019 Total lbs lost to date: 12 Total lbs lost since last in-office visit: 12  Interim History: Hannah Wiggins has lost 12 lbs. She did not find it difficult on the plan.  Subjective:   Vitamin D deficiency. Hannah Wiggins is taking Vitamin. Last Vitamin D 27.2 on 08/12/2019.  Insulin resistance. Hannah Wiggins has a diagnosis of insulin resistance based on her elevated fasting insulin level >5. She continues to work on diet and exercise to decrease her risk of diabetes.  Lab Results  Component Value Date   INSULIN 22.1 08/12/2019   Lab Results  Component Value Date   HGBA1C 5.4 08/12/2019   At risk for osteoporosis. Hannah Wiggins is at higher risk of osteopenia and osteoporosis due to Vitamin D deficiency and obesity.   Assessment/Plan:   Vitamin D deficiency. Low Vitamin D level contributes to fatigue and are associated with obesity, breast, and colon cancer. She was given a prescription for Vitamin D, Ergocalciferol, (DRISDOL) 1.25 MG (50000 UNIT) CAPS capsule every week #4 with 0 refills and will follow-up for routine testing of Vitamin D, at least 2-3 times per year to avoid over-replacement.    Insulin resistance. Hannah Wiggins will continue to work on weight loss, exercise, and decreasing simple carbohydrates to help decrease the risk of diabetes. Hannah Wiggins agreed to follow-up with [place] as directed to closely monitor her progress. Handout was given on Insulin Resistance and Prediabetes. She will decrease weight, increase exercise, and decrease  carbohydrates.  At risk for osteoporosis. Hannah Wiggins was given approximately 15 minutes of osteoporosis prevention counseling today. Hannah Wiggins is at risk for osteopenia and osteoporosis due to her Vitamin D deficiency. She was encouraged to take her Vitamin D and follow her higher calcium diet and increase strengthening exercise to help strengthen her bones and decrease her risk of osteopenia and osteoporosis.  Repetitive spaced learning was employed today to elicit superior memory formation and behavioral change.  Class 2 severe obesity with serious comorbidity and body mass index (BMI) of 37.0 to 37.9 in adult, unspecified obesity type (HCC).  Hannah Wiggins is currently in the action stage of change. As such, her goal is to continue with weight loss efforts. She has agreed to the Category 1 Plan + 100 calories.   She will work on meal planning and intentional eating. Meal planning tips were given.  We independently reviewed labs from 08/12/2019 with the patient including Vitamin D, A1c, and insulin.  Exercise goals: All adults should avoid inactivity. Some physical activity is better than none, and adults who participate in any amount of physical activity gain some health benefits.  Behavioral modification strategies: increasing lean protein intake, decreasing simple carbohydrates, increasing vegetables, increasing water intake, increasing high fiber foods, decreasing eating out, no skipping meals, meal planning and cooking strategies, keeping healthy foods in the home, better snacking choices, emotional eating strategies and planning for success.  Hannah Wiggins has agreed to follow-up with our clinic in 2 weeks. She was informed of the importance of frequent follow-up visits to maximize her success with intensive lifestyle modifications  for her multiple health conditions.   Objective:   Blood pressure 115/74, pulse 65, temperature 98.1 F (36.7 C), height 5\' 1"  (1.549 m), weight 199 lb (90.3 kg), SpO2 95 %. Body  mass index is 37.6 kg/m.  General: Cooperative, alert, well developed, in no acute distress. HEENT: Conjunctivae and lids unremarkable. Cardiovascular: Regular rhythm.  Lungs: Normal work of breathing. Neurologic: No focal deficits.   Lab Results  Component Value Date   CREATININE 0.67 05/31/2009   BUN 14 05/31/2009   NA 137 05/31/2009   K 3.9 05/31/2009   CL 107 05/31/2009   CO2 25 05/31/2009   No results found for: ALT, AST, GGT, ALKPHOS, BILITOT Lab Results  Component Value Date   HGBA1C 5.4 08/12/2019   Lab Results  Component Value Date   INSULIN 22.1 08/12/2019   No results found for: TSH No results found for: CHOL, HDL, LDLCALC, LDLDIRECT, TRIG, CHOLHDL Lab Results  Component Value Date   WBC 12.6 (H) 06/02/2009   HGB 10.9 (L) 06/02/2009   HCT 31.9 (L) 06/02/2009   MCV 88.0 06/02/2009   PLT 181 06/02/2009   No results found for: IRON, TIBC, FERRITIN  Attestation Statements:   Reviewed by clinician on day of visit: allergies, medications, problem list, medical history, surgical history, family history, social history, and previous encounter notes.  Migdalia Dk, am acting as Location manager for [employer], DO   I have reviewed the above documentation for accuracy and completeness, and I agree with the above. Jearld Lesch, DO

## 2019-09-15 ENCOUNTER — Ambulatory Visit (INDEPENDENT_AMBULATORY_CARE_PROVIDER_SITE_OTHER): Payer: 59 | Admitting: Bariatrics

## 2019-09-15 ENCOUNTER — Encounter (INDEPENDENT_AMBULATORY_CARE_PROVIDER_SITE_OTHER): Payer: Self-pay | Admitting: Bariatrics

## 2019-09-15 ENCOUNTER — Other Ambulatory Visit: Payer: Self-pay

## 2019-09-15 VITALS — BP 130/73 | HR 64 | Temp 97.8°F | Ht 61.0 in | Wt 195.0 lb

## 2019-09-15 DIAGNOSIS — Z6837 Body mass index (BMI) 37.0-37.9, adult: Secondary | ICD-10-CM

## 2019-09-15 DIAGNOSIS — E7849 Other hyperlipidemia: Secondary | ICD-10-CM | POA: Diagnosis not present

## 2019-09-15 DIAGNOSIS — I10 Essential (primary) hypertension: Secondary | ICD-10-CM | POA: Diagnosis not present

## 2019-09-15 NOTE — Progress Notes (Signed)
Chief Complaint:   OBESITY TONIANNE FINE is here to discuss her progress with her obesity treatment plan along with follow-up of her obesity related diagnoses. Gwendolyne is on the Category 1 Plan + 100 calories and states she is following her eating plan approximately 70% of the time. Kaylynne states she is biking 5 miles 3 times per week.  Today's visit was #: 3 Starting weight: 211 lbs Starting date: 08/12/2019 Today's weight: 195 lbs Today's date: 09/15/2019 Total lbs lost to date: 16 Total lbs lost since last in-office visit: 4  Interim History: Tatiana is down 4 lbs and doing well overall. She struggles with the water.  Subjective:   Essential hypertension. Zaia is taking Lotrel. Blood pressure is well controlled.  BP Readings from Last 3 Encounters:  09/15/19 130/73  08/26/19 115/74  08/12/19 111/75   Lab Results  Component Value Date   CREATININE 0.67 05/31/2009   Other hyperlipidemia. Channon is taking Mevacor.  No results found for: CHOL, HDL, LDLCALC, LDLDIRECT, TRIG, CHOLHDL No results found for: ALT, AST, GGT, ALKPHOS, BILITOT The 10-year ASCVD risk score Denman George DC Jr., et al., 2013) is: 5.2%   Values used to calculate the score:     Age: 64 years     Sex: Female     Is Non-Hispanic African American: No     Diabetic: No     Tobacco smoker: No     Systolic Blood Pressure: 130 mmHg     Is BP treated: Yes     HDL Cholesterol: 56 MG/DL     Total Cholesterol: 181 MG/DL  Assessment/Plan:   Essential hypertension. Roberto is working on healthy weight loss and exercise to improve blood pressure control. We will watch for signs of hypotension as she continues her lifestyle modifications. She will continue her medication as directed.  Other hyperlipidemia. Cardiovascular risk and specific lipid/LDL goals reviewed.  We discussed several lifestyle modifications today and Deanndra will continue to work on diet, exercise and weight loss efforts. Orders and follow up as  documented in patient record. She will continue her medication as directed.  Counseling Intensive lifestyle modifications are the first line treatment for this issue. . Dietary changes: Increase soluble fiber. Decrease simple carbohydrates. . Exercise changes: Moderate to vigorous-intensity aerobic activity 150 minutes per week if tolerated. . Lipid-lowering medications: see documented in medical record.  Class 2 severe obesity with serious comorbidity and body mass index (BMI) of 37.0 to 37.9 in adult, unspecified obesity type (HCC).  Jurline is currently in the action stage of change. As such, her goal is to continue with weight loss efforts. She has agreed to the Category 1 Plan + 100 calories.   She will work on meal planning, mindful eating, and increasing her water intake. She was given protein and calories for each meal.  Exercise goals: Drake will continue biking 5 miles 3 times per week.  Behavioral modification strategies: increasing lean protein intake, decreasing simple carbohydrates, increasing vegetables, increasing water intake, decreasing eating out, no skipping meals, meal planning and cooking strategies, keeping healthy foods in the home and planning for success.  Meah has agreed to follow-up with our clinic in 2 weeks. She was informed of the importance of frequent follow-up visits to maximize her success with intensive lifestyle modifications for her multiple health conditions.   Objective:   Blood pressure 130/73, pulse 64, temperature 97.8 F (36.6 C), height 5\' 1"  (1.549 m), weight 195 lb (88.5 kg), SpO2 98 %. Body  mass index is 36.84 kg/m.  General: Cooperative, alert, well developed, in no acute distress. HEENT: Conjunctivae and lids unremarkable. Cardiovascular: Regular rhythm.  Lungs: Normal work of breathing. Neurologic: No focal deficits.   Lab Results  Component Value Date   CREATININE 0.67 05/31/2009   BUN 14 05/31/2009   NA 137 05/31/2009   K 3.9  05/31/2009   CL 107 05/31/2009   CO2 25 05/31/2009   No results found for: ALT, AST, GGT, ALKPHOS, BILITOT Lab Results  Component Value Date   HGBA1C 5.4 08/12/2019   Lab Results  Component Value Date   INSULIN 22.1 08/12/2019   No results found for: TSH No results found for: CHOL, HDL, LDLCALC, LDLDIRECT, TRIG, CHOLHDL Lab Results  Component Value Date   WBC 12.6 (H) 06/02/2009   HGB 10.9 (L) 06/02/2009   HCT 31.9 (L) 06/02/2009   MCV 88.0 06/02/2009   PLT 181 06/02/2009   No results found for: IRON, TIBC, FERRITIN  Attestation Statements:   Reviewed by clinician on day of visit: allergies, medications, problem list, medical history, surgical history, family history, social history, and previous encounter notes.  Time spent on visit including pre-visit chart review and post-visit charting and care was 20 minutes.   Migdalia Dk, am acting as Location manager for [employer], DO   I have reviewed the above documentation for accuracy and completeness, and I agree with the above. Jearld Lesch, DO

## 2019-10-01 ENCOUNTER — Ambulatory Visit (INDEPENDENT_AMBULATORY_CARE_PROVIDER_SITE_OTHER): Payer: 59 | Admitting: Bariatrics

## 2019-10-15 ENCOUNTER — Encounter (INDEPENDENT_AMBULATORY_CARE_PROVIDER_SITE_OTHER): Payer: Self-pay | Admitting: Family Medicine

## 2019-10-15 ENCOUNTER — Ambulatory Visit (INDEPENDENT_AMBULATORY_CARE_PROVIDER_SITE_OTHER): Payer: 59 | Admitting: Family Medicine

## 2019-10-15 ENCOUNTER — Other Ambulatory Visit: Payer: Self-pay

## 2019-10-15 VITALS — BP 112/75 | HR 90 | Temp 97.8°F | Ht 61.0 in | Wt 195.0 lb

## 2019-10-15 DIAGNOSIS — Z6836 Body mass index (BMI) 36.0-36.9, adult: Secondary | ICD-10-CM

## 2019-10-15 DIAGNOSIS — E559 Vitamin D deficiency, unspecified: Secondary | ICD-10-CM | POA: Diagnosis not present

## 2019-10-15 DIAGNOSIS — Z9189 Other specified personal risk factors, not elsewhere classified: Secondary | ICD-10-CM | POA: Diagnosis not present

## 2019-10-15 DIAGNOSIS — I10 Essential (primary) hypertension: Secondary | ICD-10-CM | POA: Diagnosis not present

## 2019-10-15 MED ORDER — VITAMIN D (ERGOCALCIFEROL) 1.25 MG (50000 UNIT) PO CAPS: 50000.0000 [IU] | ORAL_CAPSULE | ORAL | 0 refills | Status: DC

## 2019-10-15 NOTE — Progress Notes (Signed)
Chief Complaint:   OBESITY Hannah Wiggins is here to discuss her progress with her obesity treatment plan along with follow-up of her obesity related diagnoses. Hannah Wiggins is on the Category 1 Plan + 100 calories and states she is following her eating plan approximately 50% of the time. Hannah Wiggins states she is riding a stationary bike 40-60 minutes 45 times per week.  Today's visit was #: 4 Starting weight: 211 lbs Starting date: 08/12/2019 Today's weight: 195 lbs Today's date: 10/15/2019 Total lbs lost to date: 16 Total lbs lost since last in-office visit: 0  Interim History: Hannah Wiggins voices she was last seen 1 month ago and did struggle the first 2 weeks secondary to Easter and a trip. She has no plans for the next few weeks. She reports she may not always get all of her protein in at dinner.  Subjective:   Vitamin D deficiency. No nausea, vomiting, or muscle weakness. Hannah Wiggins endorses fatigue. She is on prescription Vitamin D. Last Vitamin D 27.2 on 08/12/2019.  Essential hypertension. Blood pressure is very well controlled. No chest pain, chest pressure, dizziness, or lightheadedness. Hannah Wiggins is on Lotrel 10-20 mg.  BP Readings from Last 3 Encounters:  10/15/19 112/75  09/15/19 130/73  08/26/19 115/74   Lab Results  Component Value Date   CREATININE 0.67 05/31/2009   At risk for osteoporosis. Hannah Wiggins is at higher risk of osteopenia and osteoporosis due to Vitamin D deficiency.   Assessment/Plan:   Vitamin D deficiency. Low Vitamin D level contributes to fatigue and are associated with obesity, breast, and colon cancer. She was given a refill on her Vitamin D, Ergocalciferol, (DRISDOL) 1.25 MG (50000 UNIT) CAPS capsule every week #4 with 0 refills and will follow-up for routine testing of Vitamin D, at least 2-3 times per year to avoid over-replacement.    Essential hypertension. Hannah Wiggins is working on healthy weight loss and exercise to improve blood pressure control. We will watch for  signs of hypotension as she continues her lifestyle modifications. If blood pressure is controlled again at her next appointment, will decrease to 5-10 and then hopefully stop amlodipine.  At risk for osteoporosis. Hannah Wiggins was given approximately 15 minutes of osteoporosis prevention counseling today. Hannah Wiggins is at risk for osteopenia and osteoporosis due to her Vitamin D deficiency. She was encouraged to take her Vitamin D and follow her higher calcium diet and increase strengthening exercise to help strengthen her bones and decrease her risk of osteopenia and osteoporosis.  Repetitive spaced learning was employed today to elicit superior memory formation and behavioral change.  Class 2 severe obesity with serious comorbidity and body mass index (BMI) of 36.0 to 36.9 in adult, unspecified obesity type (HCC).  Hannah Wiggins is currently in the action stage of change. As such, her goal is to continue with weight loss efforts. She has agreed to the Category 2 Plan with 6 oz of meat.   Exercise goals: Hannah Wiggins will continue her current exercise regimen.  Behavioral modification strategies: increasing lean protein intake, increasing vegetables, meal planning and cooking strategies, keeping healthy foods in the home and planning for success.  Hannah Wiggins has agreed to follow-up with our clinic in 2 weeks. She was informed of the importance of frequent follow-up visits to maximize her success with intensive lifestyle modifications for her multiple health conditions.   Objective:   Blood pressure 112/75, pulse 90, temperature 97.8 F (36.6 C), temperature source Oral, height 5\' 1"  (1.549 m), weight 195 lb (88.5 kg), SpO2 99 %.  Body mass index is 36.84 kg/m.  General: Cooperative, alert, well developed, in no acute distress. HEENT: Conjunctivae and lids unremarkable. Cardiovascular: Regular rhythm.  Lungs: Normal work of breathing. Neurologic: No focal deficits.   Lab Results  Component Value Date   CREATININE  0.67 05/31/2009   BUN 14 05/31/2009   NA 137 05/31/2009   K 3.9 05/31/2009   CL 107 05/31/2009   CO2 25 05/31/2009   No results found for: ALT, AST, GGT, ALKPHOS, BILITOT Lab Results  Component Value Date   HGBA1C 5.4 08/12/2019   Lab Results  Component Value Date   INSULIN 22.1 08/12/2019   No results found for: TSH No results found for: CHOL, HDL, LDLCALC, LDLDIRECT, TRIG, CHOLHDL Lab Results  Component Value Date   WBC 12.6 (H) 06/02/2009   HGB 10.9 (L) 06/02/2009   HCT 31.9 (L) 06/02/2009   MCV 88.0 06/02/2009   PLT 181 06/02/2009   No results found for: IRON, TIBC, FERRITIN  Attestation Statements:   Reviewed by clinician on day of visit: allergies, medications, problem list, medical history, surgical history, family history, social history, and previous encounter notes.  I, Hannah Wiggins, am acting as transcriptionist for Hannah Common, MD   I have reviewed the above documentation for accuracy and completeness, and I agree with the above. - Hannah Qua, MD

## 2019-10-28 ENCOUNTER — Ambulatory Visit (INDEPENDENT_AMBULATORY_CARE_PROVIDER_SITE_OTHER): Payer: 59 | Admitting: Family Medicine

## 2019-11-08 ENCOUNTER — Other Ambulatory Visit (INDEPENDENT_AMBULATORY_CARE_PROVIDER_SITE_OTHER): Payer: Self-pay | Admitting: Family Medicine

## 2019-11-08 DIAGNOSIS — E559 Vitamin D deficiency, unspecified: Secondary | ICD-10-CM

## 2019-11-12 ENCOUNTER — Other Ambulatory Visit: Payer: Self-pay

## 2019-11-12 ENCOUNTER — Ambulatory Visit (INDEPENDENT_AMBULATORY_CARE_PROVIDER_SITE_OTHER): Payer: 59 | Admitting: Bariatrics

## 2019-11-12 ENCOUNTER — Encounter (INDEPENDENT_AMBULATORY_CARE_PROVIDER_SITE_OTHER): Payer: Self-pay | Admitting: Bariatrics

## 2019-11-12 VITALS — BP 126/73 | HR 64 | Temp 98.1°F | Ht 61.0 in | Wt 193.0 lb

## 2019-11-12 DIAGNOSIS — E559 Vitamin D deficiency, unspecified: Secondary | ICD-10-CM | POA: Diagnosis not present

## 2019-11-12 DIAGNOSIS — Z9189 Other specified personal risk factors, not elsewhere classified: Secondary | ICD-10-CM

## 2019-11-12 DIAGNOSIS — K5909 Other constipation: Secondary | ICD-10-CM

## 2019-11-12 DIAGNOSIS — E7849 Other hyperlipidemia: Secondary | ICD-10-CM

## 2019-11-12 DIAGNOSIS — K219 Gastro-esophageal reflux disease without esophagitis: Secondary | ICD-10-CM | POA: Diagnosis not present

## 2019-11-12 DIAGNOSIS — Z6836 Body mass index (BMI) 36.0-36.9, adult: Secondary | ICD-10-CM

## 2019-11-12 MED ORDER — VITAMIN D (ERGOCALCIFEROL) 1.25 MG (50000 UNIT) PO CAPS: 50000.0000 [IU] | ORAL_CAPSULE | ORAL | 0 refills | Status: DC

## 2019-11-12 NOTE — Progress Notes (Signed)
Chief Complaint:   OBESITY Hannah Wiggins is here to discuss her progress with her obesity treatment plan along with follow-up of her obesity related diagnoses. Hannah Wiggins is on the Category 2 Plan and states she is following her eating plan approximately 60% of the time. Hannah Wiggins states she is doing cardio 30-60 minutes 4 times per week.  Today's visit was #: 5 Starting weight: 211 lbs Starting date: 08/12/2019 Today's weight: 193 lbs Today's date: 11/12/2019 Total lbs lost to date: 18 Total lbs lost since last in-office visit: 2  Interim History: Jennilee is down 2 lbs and doing well overall. She has been on holiday. She reports doing fairly well with her water intake (greater than 24 oz) and doing well with her protein intake.  Subjective:   Vitamin D deficiency. No nausea, vomiting, or muscle weakness. Last Vitamin D 27.2 on 08/12/2019.  Other hyperlipidemia. Hannah Wiggins is taking lovastatin.   No results found for: CHOL, HDL, LDLCALC, LDLDIRECT, TRIG, CHOLHDL No results found for: ALT, AST, GGT, ALKPHOS, BILITOT The 10-year ASCVD risk score Hannah Bussing DC Jr., Hannah al., Hannah Wiggins) is: 4.9%   Values used to calculate the score:     Age: 64 years     Sex: Female     Is Non-Hispanic African American: No     Diabetic: No     Tobacco smoker: No     Systolic Blood Pressure: 937 mmHg     Is BP treated: Yes     HDL Cholesterol: 56 MG/DL     Total Cholesterol: 181 MG/DL  Gastroesophageal reflux disease without esophagitis. Hannah Wiggins is taking Prilosec.  Other constipation. Constipation is likely due to increased protein intake. Hannah Wiggins is taking Citracal.  At risk for dehydration. Hannah Wiggins is at risk for dehydration secondary to inadequate water intake.  Assessment/Plan:   Vitamin D deficiency. Low Vitamin D level contributes to fatigue and are associated with obesity, breast, and colon cancer. She was given a prescription for Vitamin D, Ergocalciferol, (DRISDOL) 1.25 MG (50000 UNIT) CAPS capsule every  week #4 with 0 refills and will follow-up for routine testing of Vitamin D, at least 2-3 times per year to avoid over-replacement.   Other hyperlipidemia. Cardiovascular risk and specific lipid/LDL goals reviewed.  We discussed several lifestyle modifications today and Hannah Wiggins will continue to work on diet, exercise and weight loss efforts. Orders and follow up as documented in patient record. Hannah Wiggins will continue her medication as directed.   Counseling Intensive lifestyle modifications are the first line treatment for this issue. . Dietary changes: Increase soluble fiber. Decrease simple carbohydrates. . Exercise changes: Moderate to vigorous-intensity aerobic activity 150 minutes per week if tolerated. . Lipid-lowering medications: see documented in medical record.  Gastroesophageal reflux disease without esophagitis. Intensive lifestyle modifications are the first line treatment for this issue. We discussed several lifestyle modifications today and she will continue to work on diet, exercise and weight loss efforts. Orders and follow up as documented in patient record. Hannah Wiggins will continue her medication as directed and will avoid triggers.  Counseling . If a person has gastroesophageal reflux disease (GERD), food and stomach acid move back up into the esophagus and cause symptoms or problems such as damage to the esophagus. . Anti-reflux measures include: raising the head of the bed, avoiding tight clothing or belts, avoiding eating late at night, not lying down shortly after mealtime, and achieving weight loss. . Avoid ASA, NSAID's, caffeine, alcohol, and tobacco.  . OTC Pepcid and/or Tums are often  very helpful for as needed use.  Hannah Wiggins Kitchen However, for persisting chronic or daily symptoms, stronger medications like Omeprazole may be needed. . You may need to avoid foods and drinks such as: ? Coffee and tea (with or without caffeine). ? Drinks that contain alcohol. ? Energy drinks and sports  drinks. ? Bubbly (carbonated) drinks or sodas. ? Chocolate and cocoa. ? Peppermint and mint flavorings. ? Garlic and onions. ? Horseradish. ? Spicy and acidic foods. These include peppers, chili powder, curry powder, vinegar, hot sauces, and BBQ sauce. ? Citrus fruit juices and citrus fruits, such as oranges, lemons, and limes. ? Tomato-based foods. These include red sauce, chili, salsa, and pizza with red sauce. ? Fried and fatty foods. These include donuts, french fries, potato chips, and high-fat dressings. ? High-fat meats. These include hot dogs, rib eye steak, sausage, ham, and bacon.  Other constipation. Hannah Wiggins was informed that a decrease in bowel movement frequency is normal while losing weight, but stools should not be hard or painful. Orders and follow up as documented in patient record. She will continue Citracal as directed.  Counseling Getting to Good Bowel Health: Your goal is to have one soft bowel movement each day. Drink at least 8 glasses of water each day. Eat plenty of fiber (goal is over 25 grams each day). It is best to get most of your fiber from dietary sources which includes leafy green vegetables, fresh fruit, and whole grains. You may need to add fiber with the help of OTC fiber supplements. These include Metamucil, Citrucel, and Flaxseed. If you are still having trouble, try adding Miralax or Magnesium Citrate. If all of these changes do not work, Dietitian.  At risk for dehydration. Hannah Wiggins was given approximately 15 minutes dehydration prevention counseling today. Hannah Wiggins is at risk for dehydration due to weight loss and current medication(s). She was encouraged to hydrate and monitor fluid status to avoid dehydration as well as weight loss plateaus.   Class 2 severe obesity with serious comorbidity and body mass index (BMI) of 36.0 to 36.9 in adult, unspecified obesity type (HCC).  Hannah Wiggins is currently in the action stage of change. As such, her goal is to  continue with weight loss efforts. She has agreed to the Category 2 Plan.   She will work on meal planning, intentional eating, and increasing her water intake.  Exercise goals: Hannah Wiggins will continue her current exercise regimen.  Behavioral modification strategies: increasing lean protein intake, decreasing simple carbohydrates, increasing vegetables, increasing water intake, decreasing eating out, no skipping meals, meal planning and cooking strategies, keeping healthy foods in the home and planning for success.  Hannah Wiggins has agreed to follow-up with our clinic in 3-4 weeks. She was informed of the importance of frequent follow-up visits to maximize her success with intensive lifestyle modifications for her multiple health conditions.   Objective:   Blood pressure 126/73, pulse 64, temperature 98.1 F (36.7 C), temperature source Oral, height 5\' 1"  (1.549 m), weight 193 lb (87.5 kg), SpO2 96 %. Body mass index is 36.47 kg/m.  General: Cooperative, alert, well developed, in no acute distress. HEENT: Conjunctivae and lids unremarkable. Cardiovascular: Regular rhythm.  Lungs: Normal work of breathing. Neurologic: No focal deficits.   Lab Results  Component Value Date   CREATININE 0.67 05/31/2009   BUN 14 05/31/2009   NA 137 05/31/2009   K 3.9 05/31/2009   CL 107 05/31/2009   CO2 25 05/31/2009   No results found for: ALT, AST, GGT,  ALKPHOS, BILITOT Lab Results  Component Value Date   HGBA1C 5.4 08/12/2019   Lab Results  Component Value Date   INSULIN 22.1 08/12/2019   No results found for: TSH No results found for: CHOL, HDL, LDLCALC, LDLDIRECT, TRIG, CHOLHDL Lab Results  Component Value Date   WBC 12.6 (H) 06/02/2009   HGB 10.9 (L) 06/02/2009   HCT 31.9 (L) 06/02/2009   MCV 88.0 06/02/2009   PLT 181 06/02/2009   No results found for: IRON, TIBC, FERRITIN  Attestation Statements:   Reviewed by clinician on day of visit: allergies, medications, problem list, medical  history, surgical history, family history, social history, and previous encounter notes.  Fernanda Drum, am acting as Energy manager for [employer], DO   I have reviewed the above documentation for accuracy and completeness, and I agree with the above. Corinna Capra, DO

## 2019-12-04 ENCOUNTER — Other Ambulatory Visit (INDEPENDENT_AMBULATORY_CARE_PROVIDER_SITE_OTHER): Payer: Self-pay | Admitting: Bariatrics

## 2019-12-04 DIAGNOSIS — E559 Vitamin D deficiency, unspecified: Secondary | ICD-10-CM

## 2019-12-15 ENCOUNTER — Ambulatory Visit (INDEPENDENT_AMBULATORY_CARE_PROVIDER_SITE_OTHER): Payer: 59 | Admitting: Bariatrics

## 2019-12-23 ENCOUNTER — Ambulatory Visit (INDEPENDENT_AMBULATORY_CARE_PROVIDER_SITE_OTHER): Payer: 59 | Admitting: Bariatrics

## 2020-02-18 ENCOUNTER — Other Ambulatory Visit: Payer: Self-pay

## 2020-02-18 ENCOUNTER — Ambulatory Visit (INDEPENDENT_AMBULATORY_CARE_PROVIDER_SITE_OTHER): Payer: 59 | Admitting: Bariatrics

## 2020-02-18 VITALS — BP 136/89 | HR 65 | Temp 97.9°F | Ht 61.0 in | Wt 205.0 lb

## 2020-02-18 DIAGNOSIS — E7849 Other hyperlipidemia: Secondary | ICD-10-CM

## 2020-02-18 DIAGNOSIS — E559 Vitamin D deficiency, unspecified: Secondary | ICD-10-CM | POA: Diagnosis not present

## 2020-02-18 DIAGNOSIS — R5383 Other fatigue: Secondary | ICD-10-CM

## 2020-02-18 DIAGNOSIS — Z6838 Body mass index (BMI) 38.0-38.9, adult: Secondary | ICD-10-CM

## 2020-02-18 DIAGNOSIS — E8881 Metabolic syndrome: Secondary | ICD-10-CM | POA: Diagnosis not present

## 2020-02-18 DIAGNOSIS — Z9189 Other specified personal risk factors, not elsewhere classified: Secondary | ICD-10-CM | POA: Diagnosis not present

## 2020-02-18 DIAGNOSIS — F3289 Other specified depressive episodes: Secondary | ICD-10-CM

## 2020-02-18 DIAGNOSIS — I10 Essential (primary) hypertension: Secondary | ICD-10-CM

## 2020-02-18 MED ORDER — VITAMIN D (ERGOCALCIFEROL) 1.25 MG (50000 UNIT) PO CAPS: 50000.0000 [IU] | ORAL_CAPSULE | ORAL | 0 refills | Status: DC

## 2020-02-18 MED ORDER — BUPROPION HCL ER (SR) 150 MG PO TB12: 150.0000 mg | ORAL_TABLET | Freq: Every day | ORAL | 0 refills | Status: DC

## 2020-02-18 NOTE — Progress Notes (Signed)
Chief Complaint:   OBESITY Hannah Wiggins is here to discuss her progress with her obesity treatment plan along with follow-up of her obesity related diagnoses. Hannah Wiggins is on the Category 2 Plan and states she is following her eating plan approximately 0% of the time. Hannah Wiggins states she is exercising on a stationary bike 10-15 miles 3-4 times per week.  Today's visit was #: 6 Starting weight: 211 lbs Starting date: 08/12/2019 Today's weight: 205 lbs Today's date: 02/18/2020 Total lbs lost to date: 6 Total lbs lost since last in-office visit: 0  Interim History: Hannah Wiggins is up 12 lbs. She states she has been more stressed.  Subjective:   Vitamin D deficiency. No nausea, vomiting, or muscle weakness.    Ref. Range 08/12/2019 09:40  Vitamin D, 25-Hydroxy Latest Ref Range: 30.0 - 100.0 ng/mL 27.2 (L)   Other hyperlipidemia. Alane is taking Mevacor.   No results found for: CHOL, HDL, LDLCALC, LDLDIRECT, TRIG, CHOLHDL No results found for: ALT, AST, GGT, ALKPHOS, BILITOT The 10-year ASCVD risk score Denman George DC Jr., et al., 2013) is: 5.7%   Values used to calculate the score:     Age: 64 years     Sex: Female     Is Non-Hispanic African American: No     Diabetic: No     Tobacco smoker: No     Systolic Blood Pressure: 136 mmHg     Is BP treated: Yes     HDL Cholesterol: 56 MG/DL     Total Cholesterol: 181 MG/DL  Insulin resistance. Hannah Wiggins has a diagnosis of insulin resistance based on her elevated fasting insulin level >5. She continues to work on diet and exercise to decrease her risk of diabetes. No polyphagia.  Lab Results  Component Value Date   INSULIN 22.1 08/12/2019   Lab Results  Component Value Date   HGBA1C 5.4 08/12/2019   Other fatigue. Hannah Wiggins denies temperature instability.  Essential hypertension. Alyah reports taking her blood pressure at home, which is usually good. She is off blood pressure medication.  BP Readings from Last 3 Encounters:  02/18/20 136/89    11/12/19 126/73  10/15/19 112/75   Lab Results  Component Value Date   CREATININE 0.67 05/31/2009   Other depression, with emotional eating. Hannah Wiggins is struggling with emotional eating and using food for comfort to the extent that it is negatively impacting her health. She has been working on behavior modification techniques to help reduce her emotional eating and has been somewhat successful. She shows no sign of suicidal or homicidal ideations. Hannah Wiggins endorses fibromyalgia and emotional eating.  At risk for heart disease. Hannah Wiggins is at a higher than average risk for cardiovascular disease due to other hyperlipidemia.   Assessment/Plan:   Vitamin D deficiency. Low Vitamin D level contributes to fatigue and are associated with obesity, breast, and colon cancer. She was given a prescription for Vitamin D, Ergocalciferol, (DRISDOL) 1.25 MG (50000 UNIT) CAPS capsule every week #4 with 0 refills and VITAMIN D 25 Hydroxy (Vit-D Deficiency, Fractures) level will be checked today.   Other hyperlipidemia. Cardiovascular risk and specific lipid/LDL goals reviewed.  We discussed several lifestyle modifications today and [name] will continue to work on diet, exercise and weight loss efforts. Orders and follow up as documented in patient record. Hannah Wiggins will continue her medication as directed. She will increase MUFA's, avoid trans fat, and CMP will be checked today.  Counseling Intensive lifestyle modifications are the first line treatment for this issue. Marland Kitchen  Dietary changes: Increase soluble fiber. Decrease simple carbohydrates. . Exercise changes: Moderate to vigorous-intensity aerobic activity 150 minutes per week if tolerated. . Lipid-lowering medications: see documented in medical record.  Insulin resistance. Hannah Wiggins will continue to work on weight loss, exercise, and decreasing simple carbohydrates to help decrease the risk of diabetes. Contina agreed to follow-up with [place] as directed to closely monitor her  progress. Hemoglobin A1c, Insulin, random levels will be checked today.   Other fatigue. Fatigue may be related to obesity, depression or many other causes. Labs will be ordered, and in the meanwhile, Hannah Wiggins will focus on self care including making healthy food choices, increasing physical activity and focusing on stress reduction. TSH+T4F+T3Free will be checked today.  Essential hypertension. Hannah Wiggins is working on healthy weight loss and exercise to improve blood pressure control. We will watch for signs of hypotension as she continues her lifestyle modifications. She will continue checking her blood pressure at home.  Other depression, with emotional eating. Behavior modification techniques were discussed today to help [name] deal with her emotional/non-hunger eating behaviors.  Orders and follow up as documented in patient record. Prescription was given for buPROPion (WELLBUTRIN SR) 150 MG 12 hr tablet 1 PO daily #30 with 0 refills. We discussed CBT strategies for emotional eating.  At risk for heart disease. Hannah Wiggins was given approximately 15 minutes of coronary artery disease prevention counseling today. She is 64 y.o. female and has risk factors for heart disease including obesity. We discussed intensive lifestyle modifications today with an emphasis on specific weight loss instructions and strategies.   Repetitive spaced learning was employed today to elicit superior memory formation and behavioral change.  Class 2 severe obesity with serious comorbidity and body mass index (BMI) of 38.0 to 38.9 in adult, unspecified obesity type (HCC).  Hannah Wiggins is currently in the action stage of change. As such, her goal is to continue with weight loss efforts. She has agreed to the Category 2 Plan.   She will work on meal planning, increasing her protein intake, and increasing healthy fats in her d iet.  Exercise goals: Hannah Wiggins will continue her current exercise regimen.   Behavioral modification strategies:  increasing lean protein intake, decreasing simple carbohydrates, increasing vegetables, increasing water intake, decreasing eating out, no skipping meals, meal planning and cooking strategies, keeping healthy foods in the home and planning for success.  Sophy has agreed to follow-up with our clinic in 2-3 weeks (will arrive 30 minutes early for IC test). She was informed of the importance of frequent follow-up visits to maximize her success with intensive lifestyle modifications for her multiple health conditions.   Shaddai was informed we would discuss her lab results at her next visit unless there is a critical issue that needs to be addressed sooner. Myer Peer agreed to keep her next visit at the agreed upon time to discuss these results.  Objective:   Blood pressure 136/89, pulse 65, temperature 97.9 F (36.6 C), height 5\' 1"  (1.549 m), weight 205 lb (93 kg), SpO2 95 %. Body mass index is 38.73 kg/m.  General: Cooperative, alert, well developed, in no acute distress. HEENT: Conjunctivae and lids unremarkable. Cardiovascular: Regular rhythm.  Lungs: Normal work of breathing. Neurologic: No focal deficits.   Lab Results  Component Value Date   CREATININE 0.67 05/31/2009   BUN 14 05/31/2009   NA 137 05/31/2009   K 3.9 05/31/2009   CL 107 05/31/2009   CO2 25 05/31/2009   No results found for: ALT, AST, GGT, ALKPHOS,  BILITOT Lab Results  Component Value Date   HGBA1C 5.4 08/12/2019   Lab Results  Component Value Date   INSULIN 22.1 08/12/2019   No results found for: TSH No results found for: CHOL, HDL, LDLCALC, LDLDIRECT, TRIG, CHOLHDL Lab Results  Component Value Date   WBC 12.6 (H) 06/02/2009   HGB 10.9 (L) 06/02/2009   HCT 31.9 (L) 06/02/2009   MCV 88.0 06/02/2009   PLT 181 06/02/2009   No results found for: IRON, TIBC, FERRITIN  Attestation Statements:   Reviewed by clinician on day of visit: allergies, medications, problem list, medical history, surgical history,  family history, social history, and previous encounter notes.  Fernanda Drum, am acting as Energy manager for [employer], DO   I have reviewed the above documentation for accuracy and completeness, and I agree with the above. Corinna Capra, DO

## 2020-02-19 LAB — COMPREHENSIVE METABOLIC PANEL
ALT: 12 IU/L (ref 0–32)
AST: 16 IU/L (ref 0–40)
Albumin/Globulin Ratio: 2 (ref 1.2–2.2)
Albumin: 4 g/dL (ref 3.8–4.8)
Alkaline Phosphatase: 71 IU/L (ref 48–121)
BUN/Creatinine Ratio: 15 (ref 12–28)
BUN: 11 mg/dL (ref 8–27)
Bilirubin Total: 0.5 mg/dL (ref 0.0–1.2)
CO2: 25 mmol/L (ref 20–29)
Calcium: 8.5 mg/dL — ABNORMAL LOW (ref 8.7–10.3)
Chloride: 104 mmol/L (ref 96–106)
Creatinine, Ser: 0.72 mg/dL (ref 0.57–1.00)
GFR calc Af Amer: 104 mL/min/{1.73_m2} (ref >59)
GFR calc non Af Amer: 90 mL/min/{1.73_m2} (ref >59)
Globulin, Total: 2 g/dL (ref 1.5–4.5)
Glucose: 82 mg/dL (ref 65–99)
Potassium: 4 mmol/L (ref 3.5–5.2)
Sodium: 141 mmol/L (ref 134–144)
Total Protein: 6 g/dL (ref 6.0–8.5)

## 2020-02-19 LAB — TSH+T4F+T3FREE
Free T4: 0.95 ng/dL (ref 0.82–1.77)
T3, Free: 2.5 pg/mL (ref 2.0–4.4)
TSH: 4.58 u[IU]/mL — ABNORMAL HIGH (ref 0.450–4.500)

## 2020-02-19 LAB — HEMOGLOBIN A1C
Est. average glucose Bld gHb Est-mCnc: 105 mg/dL
Hgb A1c MFr Bld: 5.3 % (ref 4.8–5.6)

## 2020-02-19 LAB — VITAMIN D 25 HYDROXY (VIT D DEFICIENCY, FRACTURES): Vit D, 25-Hydroxy: 33.3 ng/mL (ref 30.0–100.0)

## 2020-02-19 LAB — INSULIN, RANDOM: INSULIN: 12.9 u[IU]/mL (ref 2.6–24.9)

## 2020-02-23 ENCOUNTER — Encounter (INDEPENDENT_AMBULATORY_CARE_PROVIDER_SITE_OTHER): Payer: Self-pay | Admitting: Bariatrics

## 2020-03-09 ENCOUNTER — Other Ambulatory Visit (INDEPENDENT_AMBULATORY_CARE_PROVIDER_SITE_OTHER): Payer: Self-pay | Admitting: Bariatrics

## 2020-03-09 DIAGNOSIS — E559 Vitamin D deficiency, unspecified: Secondary | ICD-10-CM

## 2020-03-10 ENCOUNTER — Other Ambulatory Visit: Payer: Self-pay | Admitting: Obstetrics and Gynecology

## 2020-03-10 ENCOUNTER — Ambulatory Visit (INDEPENDENT_AMBULATORY_CARE_PROVIDER_SITE_OTHER): Payer: 59 | Admitting: Bariatrics

## 2020-03-10 ENCOUNTER — Other Ambulatory Visit: Payer: Self-pay

## 2020-03-10 ENCOUNTER — Encounter (INDEPENDENT_AMBULATORY_CARE_PROVIDER_SITE_OTHER): Payer: Self-pay | Admitting: Bariatrics

## 2020-03-10 VITALS — BP 145/84 | HR 70 | Temp 98.2°F | Ht 61.0 in | Wt 202.0 lb

## 2020-03-10 DIAGNOSIS — R0602 Shortness of breath: Secondary | ICD-10-CM

## 2020-03-10 DIAGNOSIS — Z6838 Body mass index (BMI) 38.0-38.9, adult: Secondary | ICD-10-CM

## 2020-03-10 DIAGNOSIS — R5383 Other fatigue: Secondary | ICD-10-CM | POA: Diagnosis not present

## 2020-03-10 DIAGNOSIS — Z9189 Other specified personal risk factors, not elsewhere classified: Secondary | ICD-10-CM | POA: Diagnosis not present

## 2020-03-10 DIAGNOSIS — E66812 Obesity, class 2: Secondary | ICD-10-CM

## 2020-03-10 DIAGNOSIS — E559 Vitamin D deficiency, unspecified: Secondary | ICD-10-CM | POA: Diagnosis not present

## 2020-03-10 DIAGNOSIS — R928 Other abnormal and inconclusive findings on diagnostic imaging of breast: Secondary | ICD-10-CM

## 2020-03-10 MED ORDER — VITAMIN D (ERGOCALCIFEROL) 1.25 MG (50000 UNIT) PO CAPS: 50000.0000 [IU] | ORAL_CAPSULE | ORAL | 0 refills | Status: DC

## 2020-03-11 ENCOUNTER — Ambulatory Visit
Admission: RE | Admit: 2020-03-11 | Discharge: 2020-03-11 | Disposition: A | Discharge status: Home / Self Care | Payer: BLUE CROSS/BLUE SHIELD | Source: Ambulatory Visit | Attending: Obstetrics and Gynecology | Admitting: Obstetrics and Gynecology

## 2020-03-11 ENCOUNTER — Other Ambulatory Visit (INDEPENDENT_AMBULATORY_CARE_PROVIDER_SITE_OTHER): Payer: Self-pay | Admitting: Bariatrics

## 2020-03-11 DIAGNOSIS — R928 Other abnormal and inconclusive findings on diagnostic imaging of breast: Secondary | ICD-10-CM

## 2020-03-11 DIAGNOSIS — F3289 Other specified depressive episodes: Secondary | ICD-10-CM

## 2020-03-14 NOTE — Progress Notes (Signed)
Chief Complaint:   OBESITY Hannah Wiggins is here to discuss her progress with her obesity treatment plan along with follow-up of her obesity related diagnoses. Hannah Wiggins is on the Category 2 Plan and states she is following her eating plan approximately 50% of the time. Hannah Wiggins states she is biking for 60 minutes 3 times per week.  Today's visit was #: 7 Starting weight: 211 lbs Starting date: 08/12/2019 Today's weight: 202 lbs Today's date: 03/10/2020 Total lbs lost to date: 9 lbs Total lbs lost since last in-office visit: 3 lbs  Interim History: Hannah Wiggins is down an additional 3 pounds.  Subjective:   1. Other fatigue and shortness of breath Continue activities.  RMR on 2/21 was 1477.  Today (9/23) it is 1831.  2. Vitamin D deficiency Hannah Wiggins's Vitamin D level was 33.3 on 02/18/2020. She is currently taking prescription vitamin D 50,000 IU each week. She denies nausea, vomiting or muscle weakness.  She gets minimal sunlight.  3. Hypocalcemia Calcium level is 8.5.  4. At risk for osteoporosis Hannah Wiggins is at higher risk of osteopenia and osteoporosis due to Vitamin D deficiency.   Assessment/Plan:   1. Other fatigue and shortness of breath Will gradually increase exercise.  2. Vitamin D deficiency Low Vitamin D level contributes to fatigue and are associated with obesity, breast, and colon cancer. She agrees to continue to take prescription Vitamin D @50 ,000 IU every week and will follow-up for routine testing of Vitamin D, at least 2-3 times per year to avoid over-replacement.  -Refill Vitamin D, Ergocalciferol, (DRISDOL) 1.25 MG (50000 UNIT) CAPS capsule; Take 1 capsule (50,000 Units total) by mouth every 7 (seven) days.  Dispense: 4 capsule; Refill: 0  3. Hypocalcemia Will begin calcium 1,200 mg OTC.  4. At risk for osteoporosis Hannah Wiggins was given approximately 15 minutes of osteoporosis prevention counseling today. Hannah Wiggins is at risk for osteopenia and osteoporosis due to her Vitamin D  deficiency. She was encouraged to take her Vitamin D and follow her higher calcium diet and increase strengthening exercise to help strengthen her bones and decrease her risk of osteopenia and osteoporosis.  Repetitive spaced learning was employed today to elicit superior memory formation and behavioral change.  5. Class 2 severe obesity with serious comorbidity and body mass index (BMI) of 38.0 to 38.9 in adult, unspecified obesity type (HCC) Hannah Wiggins is currently in the action stage of change. As such, her goal is to continue with weight loss efforts. She has agreed to the Category 2 Plan and the Pescatarian Plan.   She will work on meal planning, intentional eating.  Labs from 02/18/2020 were discussed with the patient including CMP, vitamin D, A1c, insulin, and thyroid panel.  Exercise goals: Will continue exercise.  Behavioral modification strategies: increasing lean protein intake, decreasing simple carbohydrates, increasing vegetables, increasing water intake, decreasing eating out, no skipping meals, meal planning and cooking strategies, keeping healthy foods in the home and keeping a strict food journal.  Hannah Wiggins has agreed to follow-up with our clinic in 3 weeks. She was informed of the importance of frequent follow-up visits to maximize her success with intensive lifestyle modifications for her multiple health conditions.   Objective:   Blood pressure (!) 145/84, pulse 70, temperature 98.2 F (36.8 C), height 5\' 1"  (1.549 m), weight 202 lb (91.6 kg), SpO2 96 %. Body mass index is 38.17 kg/m.  General: Cooperative, alert, well developed, in no acute distress. HEENT: Conjunctivae and lids unremarkable. Cardiovascular: Regular rhythm.  Lungs: Normal work  of breathing. Neurologic: No focal deficits.   Lab Results  Component Value Date   CREATININE 0.72 02/18/2020   BUN 11 02/18/2020   NA 141 02/18/2020   K 4.0 02/18/2020   CL 104 02/18/2020   CO2 25 02/18/2020   Lab Results    Component Value Date   ALT 12 02/18/2020   AST 16 02/18/2020   ALKPHOS 71 02/18/2020   BILITOT 0.5 02/18/2020   Lab Results  Component Value Date   HGBA1C 5.3 02/18/2020   HGBA1C 5.4 08/12/2019   Lab Results  Component Value Date   INSULIN 12.9 02/18/2020   INSULIN 22.1 08/12/2019   Lab Results  Component Value Date   TSH 4.580 (H) 02/18/2020   Lab Results  Component Value Date   WBC 12.6 (H) 06/02/2009   HGB 10.9 (L) 06/02/2009   HCT 31.9 (L) 06/02/2009   MCV 88.0 06/02/2009   PLT 181 06/02/2009   Attestation Statements:   Reviewed by clinician on day of visit: allergies, medications, problem list, medical history, surgical history, family history, social history, and previous encounter notes.  I, Insurance claims handler, CMA, am acting as Energy manager for [employer], DO  I have reviewed the above documentation for accuracy and completeness, and I agree with the above. Corinna Capra, DO

## 2020-03-15 ENCOUNTER — Encounter (INDEPENDENT_AMBULATORY_CARE_PROVIDER_SITE_OTHER): Payer: Self-pay | Admitting: Bariatrics

## 2020-03-24 ENCOUNTER — Other Ambulatory Visit: Payer: BLUE CROSS/BLUE SHIELD

## 2020-03-29 ENCOUNTER — Ambulatory Visit (INDEPENDENT_AMBULATORY_CARE_PROVIDER_SITE_OTHER): Payer: 59 | Admitting: Adult Health

## 2020-04-03 ENCOUNTER — Other Ambulatory Visit (INDEPENDENT_AMBULATORY_CARE_PROVIDER_SITE_OTHER): Payer: Self-pay | Admitting: Bariatrics

## 2020-04-03 DIAGNOSIS — E559 Vitamin D deficiency, unspecified: Secondary | ICD-10-CM

## 2020-04-06 ENCOUNTER — Other Ambulatory Visit: Payer: Self-pay

## 2020-04-06 ENCOUNTER — Ambulatory Visit (INDEPENDENT_AMBULATORY_CARE_PROVIDER_SITE_OTHER): Payer: 59 | Admitting: Adult Health

## 2020-04-06 ENCOUNTER — Encounter (INDEPENDENT_AMBULATORY_CARE_PROVIDER_SITE_OTHER): Payer: Self-pay | Admitting: Adult Health

## 2020-04-06 VITALS — BP 96/65 | HR 75 | Temp 97.2°F | Ht 61.0 in | Wt 200.0 lb

## 2020-04-06 DIAGNOSIS — Z9189 Other specified personal risk factors, not elsewhere classified: Secondary | ICD-10-CM

## 2020-04-06 DIAGNOSIS — E8881 Metabolic syndrome: Secondary | ICD-10-CM | POA: Diagnosis not present

## 2020-04-06 DIAGNOSIS — F3289 Other specified depressive episodes: Secondary | ICD-10-CM

## 2020-04-06 DIAGNOSIS — F411 Generalized anxiety disorder: Secondary | ICD-10-CM | POA: Diagnosis not present

## 2020-04-06 DIAGNOSIS — I10 Essential (primary) hypertension: Secondary | ICD-10-CM

## 2020-04-06 DIAGNOSIS — E559 Vitamin D deficiency, unspecified: Secondary | ICD-10-CM

## 2020-04-06 DIAGNOSIS — Z6837 Body mass index (BMI) 37.0-37.9, adult: Secondary | ICD-10-CM

## 2020-04-06 MED ORDER — VITAMIN D (ERGOCALCIFEROL) 1.25 MG (50000 UNIT) PO CAPS: 50000.0000 [IU] | ORAL_CAPSULE | ORAL | 0 refills | Status: DC

## 2020-04-07 DIAGNOSIS — F32A Depression, unspecified: Secondary | ICD-10-CM | POA: Insufficient documentation

## 2020-04-07 DIAGNOSIS — F411 Generalized anxiety disorder: Secondary | ICD-10-CM | POA: Insufficient documentation

## 2020-04-07 DIAGNOSIS — E8881 Metabolic syndrome: Secondary | ICD-10-CM | POA: Insufficient documentation

## 2020-04-07 NOTE — Progress Notes (Signed)
Chief Complaint:   OBESITY Hannah Wiggins is here to discuss her progress with her obesity treatment plan along with follow-up of her obesity related diagnoses. Hannah Wiggins is on the Category 2 Plan and the Pescatarian Plan and states she is following her eating plan approximately 50% of the time. Hannah Wiggins states she is biking 30 minutes 3 times per week.  Today's visit was #: 8 Starting weight: 211 lbs Starting date: 08/12/2019 Today's weight: 200 lbs Today's date: 04/06/2020 Total lbs lost to date: 11 Total lbs lost since last in-office visit: 2  Interim History: Hannah Wiggins stopped bupropion SR 150 QD due to side effects of increased anxiety. She now reports stable mood and hunger level is manageable. She has been following the Category 2 meal plan with emphasis on fish protein at dinner meal.  Subjective:   Vitamin D deficiency. Vitamin D level on 02/18/2020 was 33.3, below goal of 50. Hannah Wiggins is on Ergocalciferol. No nausea, vomiting, or muscle weakness.   Ref. Range 02/18/2020 07:48  Vitamin D, 25-Hydroxy Latest Ref Range: 30.0 - 100.0 ng/mL 33.3   Insulin resistance. Hannah Wiggins has a diagnosis of insulin resistance based on her elevated fasting insulin level >5. She continues to work on diet and exercise to decrease her risk of diabetes. 02/18/2020 blood glucose 82, A1c 5.3 with an insulin level of 12.9 (insulin level improving).  Lab Results  Component Value Date   INSULIN 12.9 02/18/2020   INSULIN 22.1 08/12/2019   Lab Results  Component Value Date   HGBA1C 5.3 02/18/2020   Essential hypertension. Blood pressure is a little soft today.  She was monitoring her blood pressure and heart rate at home and pressure was trending up and she restarted amlodipine/benazepril 10/20 mg daily on 03/08/2020. She reports increased fatigue. She denies cardiac symptoms.  BP Readings from Last 3 Encounters:  04/06/20 96/65  03/10/20 (!) 145/84  02/18/20 136/89   Lab Results  Component Value Date     CREATININE 0.72 02/18/2020   CREATININE 0.67 05/31/2009   GAD (generalized anxiety disorder). Tomicka restarted on Lexapro 20 mg more than 1 year ago and reports anxiety symptoms are well controlled. Lexapro also treats fibromyalgia symptoms.  She experienced increased anxiety sx's with Bupropion SR 150mg  QD, therefore she stopped Rx >1 week ago.  Other depression, with emotional eating.  She shows no sign of suicidal or homicidal ideations. Jalisia experienced exacerbation of anxiety symptoms after 3 weeks of treatment with bupropion SR 150 mg QD.She stopped Rx >1 week ago and denies sx's of withdraw.  She reports hunger levels are stable.  At risk for heart disease. Keylani is at a higher than average risk for cardiovascular disease due to hyperlipidemia and obesity.   Assessment/Plan:   Vitamin D deficiency. Low Vitamin D level contributes to fatigue and are associated with obesity, breast, and colon cancer. She was given a refill on her Vitamin D, Ergocalciferol, (DRISDOL) 1.25 MG (50000 UNIT) CAPS capsule every week #4 with 0 refills and will follow-up for routine testing of Vitamin D, at least 2-3 times per year to avoid over-replacement.   Insulin resistance. Hannah Wiggins will continue to work on weight loss, exercise, and decreasing simple carbohydrates to help decrease the risk of diabetes. Hannah Wiggins agreed to follow-up with Hannah Wiggins Braun as directed to closely monitor her progress. She will continue to follow the Category 2 meal plan.  Essential hypertension. Hannah Wiggins is working on healthy weight loss and exercise to improve blood pressure control. We will watch for  signs of hypotension as she continues her lifestyle modifications. She will check blood pressure and heart rate several times per week and bring readings to her follow-up appointment.  GAD (generalized anxiety disorder). Hannah Wiggins was instructed to continue Lexapro as directed and continue regular exercise.  Other depression, with emotional eating. Behavior  modification techniques were discussed today to help Hannah Wiggins deal with her emotional/non-hunger eating behaviors.  Orders and follow up as documented in patient record. Fontella will remain off bupropion at this time.  At risk for heart disease. Hannah Wiggins was given approximately 15 minutes of coronary artery disease prevention counseling today. She is 64 y.o. female and has risk factors for heart disease including obesity. We discussed intensive lifestyle modifications today with an emphasis on specific weight loss instructions and strategies.   Repetitive spaced learning was employed today to elicit superior memory formation and behavioral change.  Class 2 severe obesity with serious comorbidity and body mass index (BMI) of 37.0 to 37.9 in adult, unspecified obesity type (HCC).  Hannah Wiggins is currently in the action stage of change. As such, her goal is to continue with weight loss efforts. She has agreed to the Category 2 Plan. She will keep snack calories less than 200 of foods that she enjoys or with heavy emphasis with seafood.  Exercise goals: Hannah Wiggins will continue biking 30 minutes 3 times per week.  Behavioral modification strategies: increasing lean protein intake, decreasing simple carbohydrates, meal planning and cooking strategies and planning for success.  Hannah Wiggins has agreed to follow-up with our clinic in 2 weeks. She was informed of the importance of frequent follow-up visits to maximize her success with intensive lifestyle modifications for her multiple health conditions.   Objective:   Blood pressure 96/65, pulse 75, temperature (!) 97.2 F (36.2 C), height 5\' 1"  (1.549 m), weight 200 lb (90.7 kg), SpO2 96 %. Body mass index is 37.79 kg/m.  General: Cooperative, alert, well developed, in no acute distress. HEENT: Conjunctivae and lids unremarkable. Cardiovascular: Regular rhythm.  Lungs: Normal work of breathing. Neurologic: No focal deficits.   Lab Results  Component Value Date    CREATININE 0.72 02/18/2020   BUN 11 02/18/2020   NA 141 02/18/2020   K 4.0 02/18/2020   CL 104 02/18/2020   CO2 25 02/18/2020   Lab Results  Component Value Date   ALT 12 02/18/2020   AST 16 02/18/2020   ALKPHOS 71 02/18/2020   BILITOT 0.5 02/18/2020   Lab Results  Component Value Date   HGBA1C 5.3 02/18/2020   HGBA1C 5.4 08/12/2019   Lab Results  Component Value Date   INSULIN 12.9 02/18/2020   INSULIN 22.1 08/12/2019   Lab Results  Component Value Date   TSH 4.580 (H) 02/18/2020   No results found for: CHOL, HDL, LDLCALC, LDLDIRECT, TRIG, CHOLHDL Lab Results  Component Value Date   WBC 12.6 (H) 06/02/2009   HGB 10.9 (L) 06/02/2009   HCT 31.9 (L) 06/02/2009   MCV 88.0 06/02/2009   PLT 181 06/02/2009   No results found for: IRON, TIBC, FERRITIN  Attestation Statements:   Reviewed by clinician on day of visit: allergies, medications, problem list, medical history, surgical history, family history, social history, and previous encounter notes.  I, 06/04/2009, am acting as Marianna Payment for Energy manager, NP-C   I have reviewed the above documentation for accuracy and completeness, and I agree with the above. -  Patt Steinhardt d. Neytiri Asche, NP-C

## 2020-04-20 ENCOUNTER — Ambulatory Visit (INDEPENDENT_AMBULATORY_CARE_PROVIDER_SITE_OTHER): Payer: 59 | Admitting: Adult Health

## 2020-04-28 ENCOUNTER — Ambulatory Visit (INDEPENDENT_AMBULATORY_CARE_PROVIDER_SITE_OTHER): Payer: 59 | Admitting: Adult Health

## 2020-04-28 ENCOUNTER — Other Ambulatory Visit: Payer: Self-pay

## 2020-04-28 ENCOUNTER — Encounter (INDEPENDENT_AMBULATORY_CARE_PROVIDER_SITE_OTHER): Payer: Self-pay | Admitting: Adult Health

## 2020-04-28 VITALS — BP 101/66 | HR 78 | Temp 97.6°F | Ht 61.0 in | Wt 202.0 lb

## 2020-04-28 DIAGNOSIS — Z6838 Body mass index (BMI) 38.0-38.9, adult: Secondary | ICD-10-CM

## 2020-04-28 DIAGNOSIS — I10 Essential (primary) hypertension: Secondary | ICD-10-CM | POA: Diagnosis not present

## 2020-04-28 DIAGNOSIS — E559 Vitamin D deficiency, unspecified: Secondary | ICD-10-CM | POA: Diagnosis not present

## 2020-04-28 DIAGNOSIS — Z9189 Other specified personal risk factors, not elsewhere classified: Secondary | ICD-10-CM

## 2020-04-28 MED ORDER — VITAMIN D (ERGOCALCIFEROL) 1.25 MG (50000 UNIT) PO CAPS: 50000.0000 [IU] | ORAL_CAPSULE | ORAL | 0 refills | Status: AC

## 2020-05-02 NOTE — Progress Notes (Signed)
Chief Complaint:   OBESITY Hannah Wiggins is here to discuss her progress with her obesity treatment plan along with follow-up of her obesity related diagnoses. Hannah Wiggins is on the Category 2 Plan and states she is following her eating plan approximately 50% of the time. Hannah Wiggins states she is biking 30-60 minutes 3-4 times per week.  Today's visit was #: 9 Starting weight: 211 lbs Starting date: 08/12/2019 Today's weight: 202 lbs Today's date: 04/28/2020 Total lbs lost to date: 9 Total lbs lost since last in-office visit: 0  Interim History: Hannah Wiggins has chronic lumbar back pain with bilateral sciatica pain stemming from a herniated disc. She is followed by an orthopedic specialist. She continues to be challenged with keeping snack calories less than 200 a day, i.e., candy. However she feels that she has been quite consistent with following meals on the Category 2 meal plan.  Subjective:   Essential hypertension. Blood pressure and heart rate are stable on today's office visit. Hannah Wiggins is on amlodipine/benazepril 10/20 mg daily.  Vitamin D deficiency. Vitamin D level on 02/18/2020 was 33.3, below goal of 50. Hannah Wiggins is on Ergocalciferol. No nausea, vomiting, or muscle weakness.    Ref. Range 02/18/2020 07:48  Vitamin D, 25-Hydroxy Latest Ref Range: 30.0 - 100.0 ng/mL 33.3   At risk for osteoporosis. Hannah Wiggins is at higher risk of osteopenia and osteoporosis due to Vitamin D deficiency and obesity.   Assessment/Plan:   Essential hypertension. Aston is working on healthy weight loss and exercise to improve blood pressure control. We will watch for signs of hypotension as she continues her lifestyle modifications. She will continue her current antihypertensives as directed.   Vitamin D deficiency. Low Vitamin D level contributes to fatigue and are associated with obesity, breast, and colon cancer. She was given a refill on her Vitamin D, Ergocalciferol, (DRISDOL) 1.25 MG (50000 UNIT) CAPS  capsule every week #4 with 0 refills and will follow-up for routine testing of Vitamin D, at least 2-3 times per year to avoid over-replacement.   At risk for osteoporosis. Hannah Wiggins was given approximately 15 minutes of osteoporosis prevention counseling today. Hannah Wiggins is at risk for osteopenia and osteoporosis due to her Vitamin D deficiency. She was encouraged to take her Vitamin D and follow her higher calcium diet and increase strengthening exercise to help strengthen her bones and decrease her risk of osteopenia and osteoporosis.  Repetitive spaced learning was employed today to elicit superior memory formation and behavioral change.  Class 2 severe obesity with serious comorbidity and body mass index (BMI) of 38.0 to 38.9 in adult, unspecified obesity type (HCC).  Hannah Wiggins is currently in the action stage of change. As such, her goal is to continue with weight loss efforts. She has agreed to the Category 2 Plan.   She will try to snack on higher protein foods and will measure out 200 calorie snacks of candy and then bag amount.  Handouts were provided on: Halloween, High Protein/Low Calorie Foods, and Thanksgiving.  Exercise goals: Hannah Wiggins will continue biking 30-60 minutes 3-4 times per week.  Behavioral modification strategies: increasing lean protein intake, meal planning and cooking strategies, better snacking choices and planning for success.  Hannah Wiggins has agreed to follow-up with our clinic in 3 weeks. She was informed of the importance of frequent follow-up visits to maximize her success with intensive lifestyle modifications for her multiple health conditions.   Objective:   Pulse 78, temperature 97.6 F (36.4 C), height 5\' 1"  (1.549 m), weight  202 lb (91.6 kg), SpO2 100 %. Body mass index is 38.17 kg/m.  General: Cooperative, alert, well developed, in no acute distress. HEENT: Conjunctivae and lids unremarkable. Cardiovascular: Regular rhythm.  Lungs: Normal work of  breathing. Neurologic: No focal deficits.   Lab Results  Component Value Date   CREATININE 0.72 02/18/2020   BUN 11 02/18/2020   NA 141 02/18/2020   K 4.0 02/18/2020   CL 104 02/18/2020   CO2 25 02/18/2020   Lab Results  Component Value Date   ALT 12 02/18/2020   AST 16 02/18/2020   ALKPHOS 71 02/18/2020   BILITOT 0.5 02/18/2020   Lab Results  Component Value Date   HGBA1C 5.3 02/18/2020   HGBA1C 5.4 08/12/2019   Lab Results  Component Value Date   INSULIN 12.9 02/18/2020   INSULIN 22.1 08/12/2019   Lab Results  Component Value Date   TSH 4.580 (H) 02/18/2020   No results found for: CHOL, HDL, LDLCALC, LDLDIRECT, TRIG, CHOLHDL Lab Results  Component Value Date   WBC 12.6 (H) 06/02/2009   HGB 10.9 (L) 06/02/2009   HCT 31.9 (L) 06/02/2009   MCV 88.0 06/02/2009   PLT 181 06/02/2009   No results found for: IRON, TIBC, FERRITIN  Attestation Statements:   Reviewed by clinician on day of visit: allergies, medications, problem list, medical history, surgical history, family history, social history, and previous encounter notes.  I, Marianna Payment, am acting as Energy manager for [employer], NP-C   I have reviewed the above documentation for accuracy and completeness, and I agree with the above. -  Yadir Zentner d. Bentzion Dauria, NP-C

## 2020-05-26 ENCOUNTER — Ambulatory Visit (INDEPENDENT_AMBULATORY_CARE_PROVIDER_SITE_OTHER): Payer: 59 | Admitting: Physician Assistant

## 2020-05-27 ENCOUNTER — Other Ambulatory Visit (INDEPENDENT_AMBULATORY_CARE_PROVIDER_SITE_OTHER): Payer: Self-pay | Admitting: Adult Health

## 2020-05-27 DIAGNOSIS — E559 Vitamin D deficiency, unspecified: Secondary | ICD-10-CM

## 2020-06-22 ENCOUNTER — Other Ambulatory Visit (INDEPENDENT_AMBULATORY_CARE_PROVIDER_SITE_OTHER): Payer: Self-pay | Admitting: Bariatrics

## 2020-06-22 DIAGNOSIS — E559 Vitamin D deficiency, unspecified: Secondary | ICD-10-CM

## 2020-10-31 ENCOUNTER — Other Ambulatory Visit: Payer: Self-pay | Admitting: Obstetrics and Gynecology

## 2020-10-31 DIAGNOSIS — N6452 Nipple discharge: Secondary | ICD-10-CM

## 2020-12-05 ENCOUNTER — Other Ambulatory Visit: Payer: 59

## 2020-12-23 ENCOUNTER — Ambulatory Visit
Admission: RE | Admit: 2020-12-23 | Discharge: 2020-12-23 | Disposition: A | Discharge status: Home / Self Care | Payer: 59 | Source: Ambulatory Visit | Attending: Obstetrics and Gynecology | Admitting: Obstetrics and Gynecology

## 2020-12-23 ENCOUNTER — Other Ambulatory Visit: Payer: Self-pay | Admitting: Obstetrics and Gynecology

## 2020-12-23 ENCOUNTER — Other Ambulatory Visit: Payer: Self-pay

## 2020-12-23 DIAGNOSIS — N6452 Nipple discharge: Secondary | ICD-10-CM

## 2021-01-04 ENCOUNTER — Other Ambulatory Visit: Payer: Self-pay

## 2021-01-04 ENCOUNTER — Ambulatory Visit
Admission: RE | Admit: 2021-01-04 | Discharge: 2021-01-04 | Disposition: A | Discharge status: Home / Self Care | Payer: 59 | Source: Ambulatory Visit | Attending: Obstetrics and Gynecology | Admitting: Obstetrics and Gynecology

## 2021-01-04 ENCOUNTER — Other Ambulatory Visit: Payer: Self-pay | Admitting: Obstetrics and Gynecology

## 2021-01-04 DIAGNOSIS — N6452 Nipple discharge: Secondary | ICD-10-CM

## 2021-01-16 ENCOUNTER — Ambulatory Visit
Admission: RE | Admit: 2021-01-16 | Discharge: 2021-01-16 | Disposition: A | Discharge status: Home / Self Care | Payer: 59 | Source: Ambulatory Visit | Attending: Obstetrics and Gynecology | Admitting: Obstetrics and Gynecology

## 2021-01-16 DIAGNOSIS — N6452 Nipple discharge: Secondary | ICD-10-CM

## 2021-01-16 MED ORDER — GADOBUTROL 1 MMOL/ML IV SOLN
10.0000 mL | Freq: Once | INTRAVENOUS | Status: AC | PRN
  Administered 2021-01-16: 10 mL via INTRAVENOUS

## 2021-08-28 DIAGNOSIS — E785 Hyperlipidemia, unspecified: Secondary | ICD-10-CM | POA: Diagnosis not present

## 2021-08-28 DIAGNOSIS — R69 Illness, unspecified: Secondary | ICD-10-CM | POA: Diagnosis not present

## 2021-08-28 DIAGNOSIS — Z6841 Body Mass Index (BMI) 40.0 and over, adult: Secondary | ICD-10-CM | POA: Diagnosis not present

## 2021-08-28 DIAGNOSIS — K219 Gastro-esophageal reflux disease without esophagitis: Secondary | ICD-10-CM | POA: Diagnosis not present

## 2021-08-28 DIAGNOSIS — I10 Essential (primary) hypertension: Secondary | ICD-10-CM | POA: Diagnosis not present

## 2021-08-28 DIAGNOSIS — Z1211 Encounter for screening for malignant neoplasm of colon: Secondary | ICD-10-CM | POA: Diagnosis not present

## 2021-08-28 DIAGNOSIS — L309 Dermatitis, unspecified: Secondary | ICD-10-CM | POA: Diagnosis not present

## 2021-08-30 DIAGNOSIS — Z1211 Encounter for screening for malignant neoplasm of colon: Secondary | ICD-10-CM | POA: Diagnosis not present

## 2022-01-24 ENCOUNTER — Encounter (INDEPENDENT_AMBULATORY_CARE_PROVIDER_SITE_OTHER): Payer: Self-pay

## 2022-02-26 DIAGNOSIS — R051 Acute cough: Secondary | ICD-10-CM | POA: Diagnosis not present

## 2022-02-26 DIAGNOSIS — J3489 Other specified disorders of nose and nasal sinuses: Secondary | ICD-10-CM | POA: Diagnosis not present

## 2022-02-26 DIAGNOSIS — R062 Wheezing: Secondary | ICD-10-CM | POA: Diagnosis not present

## 2022-05-07 DIAGNOSIS — E785 Hyperlipidemia, unspecified: Secondary | ICD-10-CM | POA: Diagnosis not present

## 2022-05-07 DIAGNOSIS — Z0000 Encounter for general adult medical examination without abnormal findings: Secondary | ICD-10-CM | POA: Diagnosis not present

## 2022-05-07 DIAGNOSIS — F411 Generalized anxiety disorder: Secondary | ICD-10-CM | POA: Diagnosis not present

## 2022-05-07 DIAGNOSIS — I10 Essential (primary) hypertension: Secondary | ICD-10-CM | POA: Diagnosis not present

## 2022-05-07 DIAGNOSIS — K219 Gastro-esophageal reflux disease without esophagitis: Secondary | ICD-10-CM | POA: Diagnosis not present

## 2022-05-07 DIAGNOSIS — Z23 Encounter for immunization: Secondary | ICD-10-CM | POA: Diagnosis not present

## 2022-11-24 IMAGING — MR MR BREAST BILAT WO/W CM
8 of 12 series · 32 of 48 positions shown · IV contrast (gadavist)
Comparison: 12/23/2020 mammogram/ultrasound and prior mammograms

CLINICAL DATA: 64-year-old female for further evaluation of bloody
RIGHT nipple discharge. A 0.5 cm hypoechoic intraductal area within
the RETROAREOLAR RIGHT breast was identified sonographically on
12/23/2020, but could not be visualized when the patient returned
for biopsy on 01/04/2021 suggesting intraductal debris.

LABS:  Not applicable
EXAM:
BILATERAL BREAST MRI WITH AND WITHOUT CONTRAST
TECHNIQUE: Multiplanar, multisequence MR images of both breasts were obtained
prior to and following the intravenous administration of 10 ml of
Gadavist

[Series 3: t2_tirm_tra ipat (a-p) · axial · 3.0mm · 0.70mm/px · 1 of 55 slices shown]
[im 1/55]
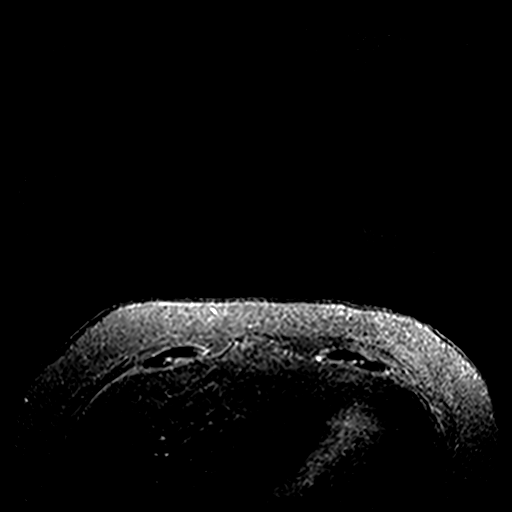

[Series 4: fl3d pre-cm no · axial · non-contrast · 1.2mm · 0.94mm/px · z∈[-26,+146]mm · 5 of 144 slices shown]
[im 1/144]
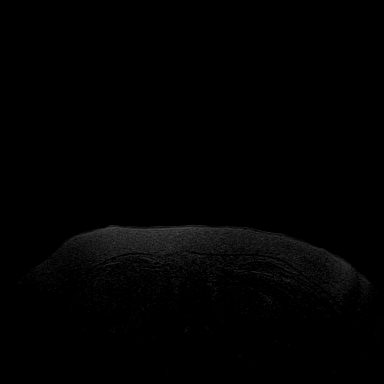
[im 36/144]
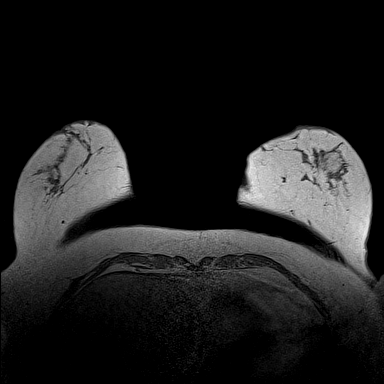
[im 72/144]
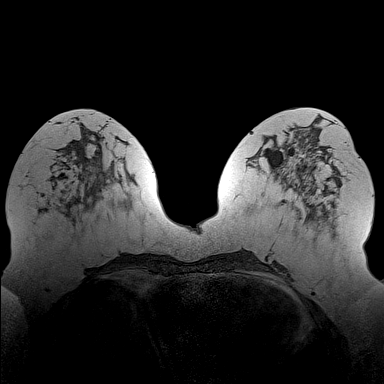
[im 108/144]
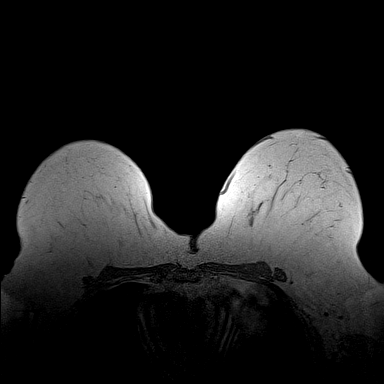
[im 144/144]
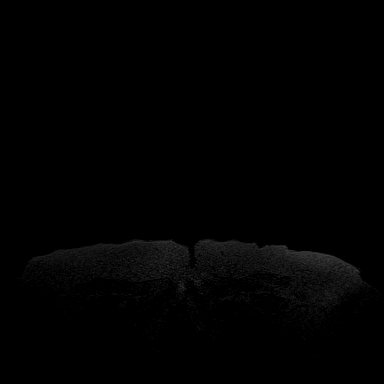

[Series 5: fl3d pre-cm · axial · non-contrast · 1.2mm · 0.94mm/px · z∈[-26,+146]mm · 5 of 144 slices shown]
[im 1/144]
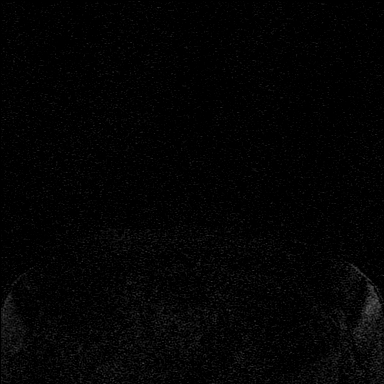
[im 36/144]
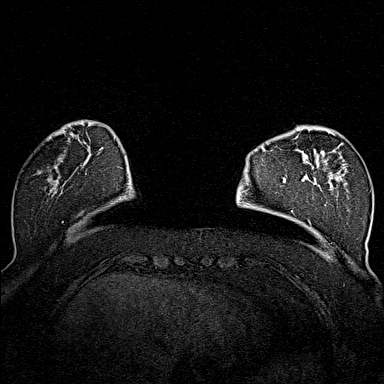
[im 72/144]
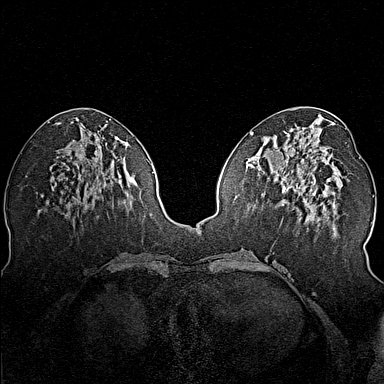
[im 108/144]
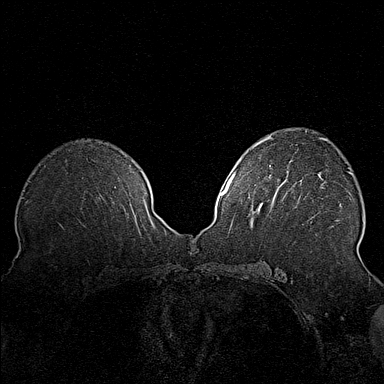
[im 144/144]
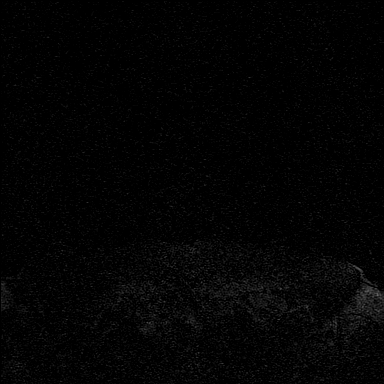

[Series 6: fl3d post-cm 20 · axial · 1.2mm · 0.94mm/px · z∈[-26,+146]mm · 5 of 144 slices shown (1 of 3)]
[im 1/144]
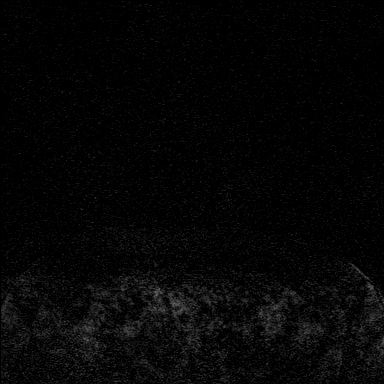
[im 36/144]
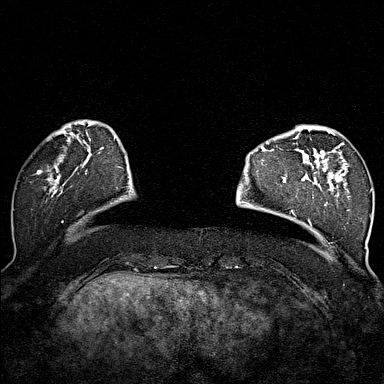
[im 72/144]
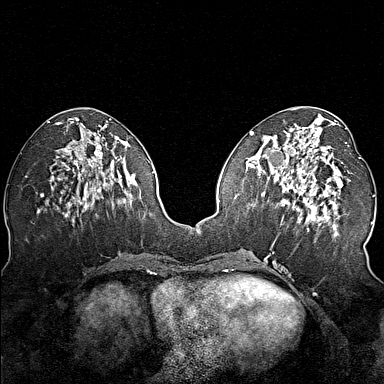
[im 108/144]
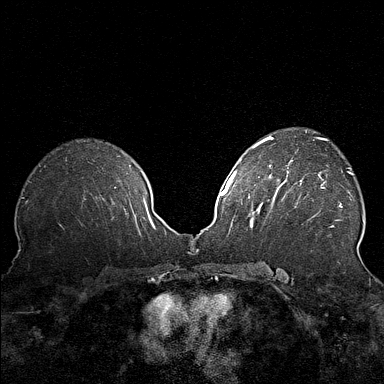
[im 144/144]
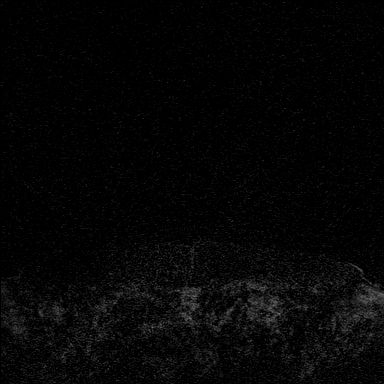

[Series 7: fl3d post-cm 20 · axial · 1.2mm · 0.94mm/px · z∈[-26,+146]mm · 5 of 144 slices shown (2 of 3)]
[im 1/144]
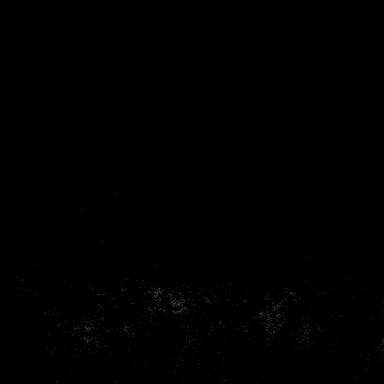
[im 36/144]
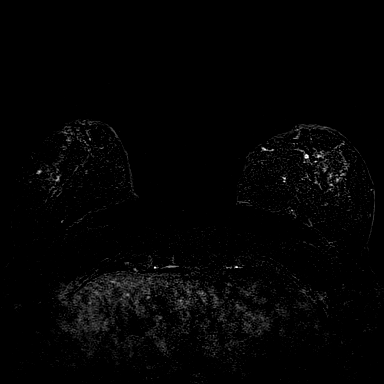
[im 72/144]
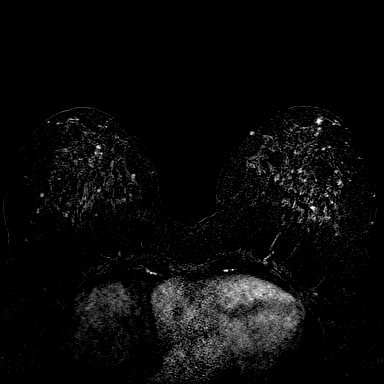
[im 108/144]
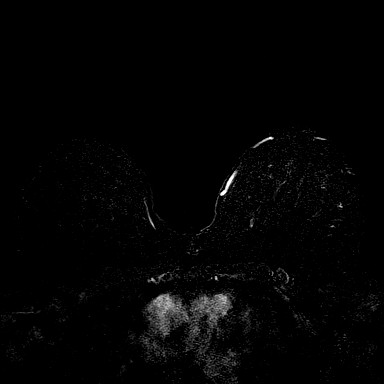
[im 144/144]
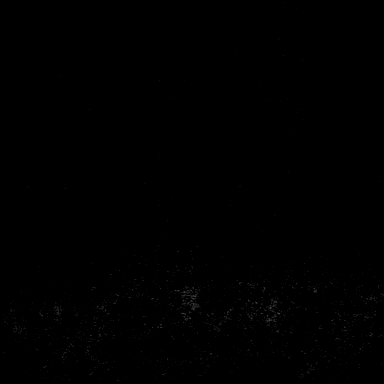

[Series 8: fl3d post-cm 20 · axial · 172.8mm · 0.94mm/px · 1 of 1 slices shown (3 of 3)]
[im 1/1]
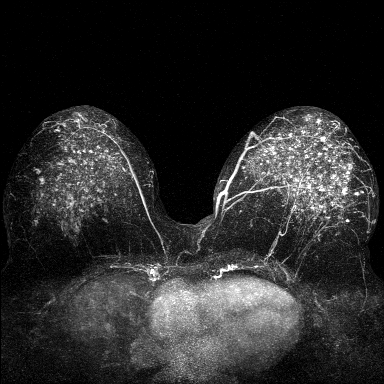

[Series 9: fl3d post-cm 3min · axial · 1.2mm · 0.94mm/px · z∈[-26,+146]mm · 6 of 144 slices shown]
[im 1/144]
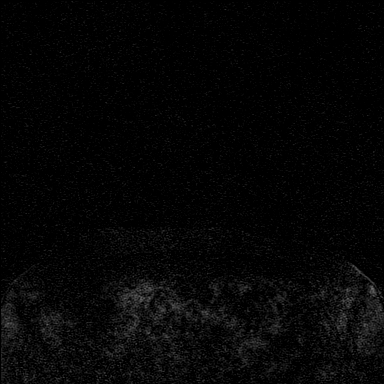
[im 29/144]
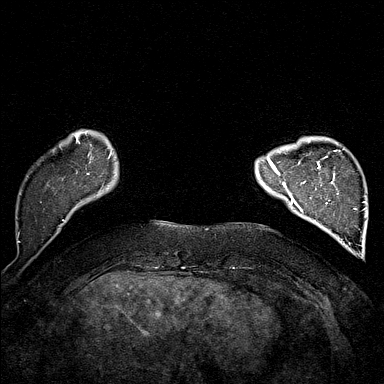
[im 58/144]
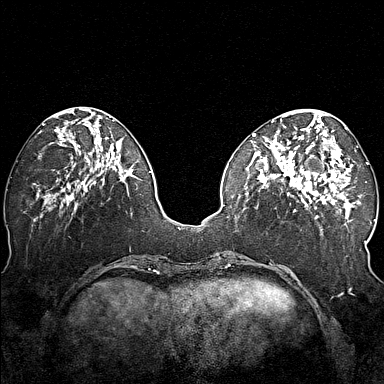
[im 86/144]
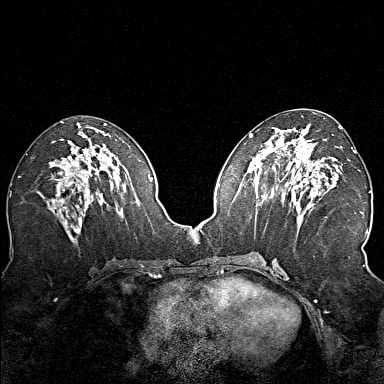
[im 115/144]
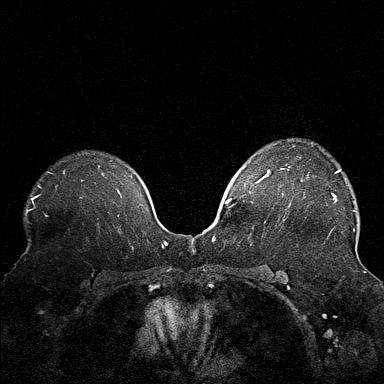
[im 144/144]
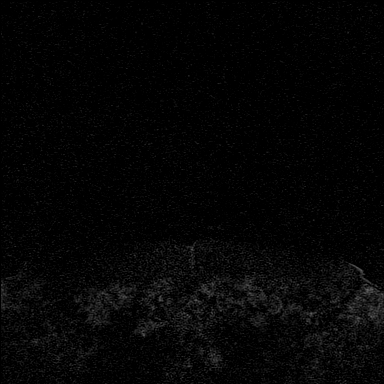

[Series 10: fl3d post-cm 3min_sub · axial · 1.2mm · 0.94mm/px · z∈[-26,+76]mm · 4 of 144 slices shown]
[im 1/144]
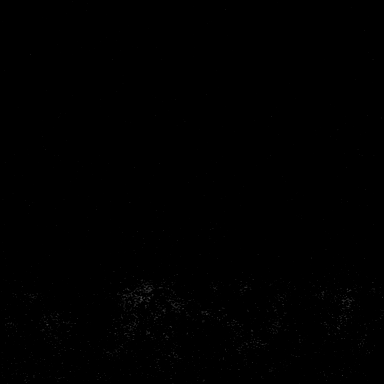
[im 29/144]
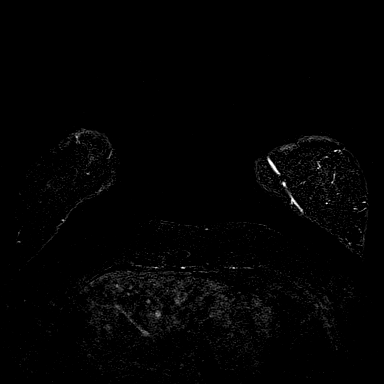
[im 58/144]
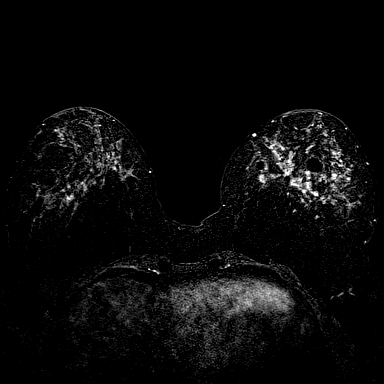
[im 86/144]
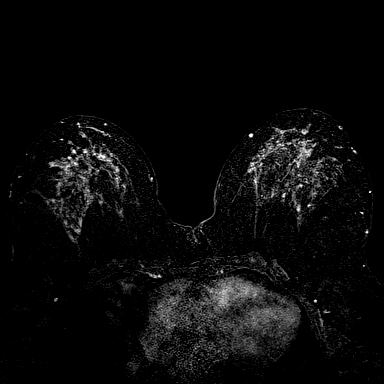

[32 of 48 positions shown; findings below may reference images not displayed]

Three-dimensional MR images were rendered by post-processing of the
original MR data on an independent workstation. The
three-dimensional MR images were interpreted, and findings are
reported in the following complete MRI report for this study. Three
dimensional images were evaluated at the independent interpreting
workstation using the DynaCAD thin client.
FINDINGS: Breast composition: c. Heterogeneous fibroglandular tissue.

Background parenchymal enhancement: Moderate to marked, which
decreases sensitivity/specificity

Right breast: Multiple similar appearing scattered foci are noted.
Given background parenchymal pattern, no definite suspicious
abnormalities are noted. No definite abnormality is identified
within the immediate RETROAREOLAR region. A few tiny cysts are
present.

Left breast: Multiple similar appearing scattered foci are noted.
Given background parenchymal enhancement, no definite suspicious
abnormalities are noted. Scattered cysts are present.

Lymph nodes: No abnormal appearing lymph nodes.

Ancillary findings:  None.
IMPRESSION: 1. No MR findings to suggest a cause for this patient's RIGHT nipple
discharge. Surgical consultation/follow-up is recommended.
2. No definite suspicious MR findings to suggest malignancy within
either breast but moderate to marked background parenchymal
enhancement limits sensitivity/specificity. However, dense
fibroglandular tissue and pattern have been unchanged
mammographically for over 10 years.
3. Benign cysts within both breast.

RECOMMENDATION:
Surgical consultation/follow-up for RIGHT nipple discharge.

BI-RADS CATEGORY  2: Benign.

## 2022-12-03 DIAGNOSIS — R300 Dysuria: Secondary | ICD-10-CM | POA: Diagnosis not present

## 2022-12-03 DIAGNOSIS — Z6841 Body Mass Index (BMI) 40.0 and over, adult: Secondary | ICD-10-CM | POA: Diagnosis not present

## 2022-12-03 DIAGNOSIS — N390 Urinary tract infection, site not specified: Secondary | ICD-10-CM | POA: Diagnosis not present

## 2023-04-30 DIAGNOSIS — Z124 Encounter for screening for malignant neoplasm of cervix: Secondary | ICD-10-CM | POA: Diagnosis not present

## 2023-04-30 DIAGNOSIS — Z1231 Encounter for screening mammogram for malignant neoplasm of breast: Secondary | ICD-10-CM | POA: Diagnosis not present

## 2023-05-13 DIAGNOSIS — I10 Essential (primary) hypertension: Secondary | ICD-10-CM | POA: Diagnosis not present

## 2023-05-13 DIAGNOSIS — Z0000 Encounter for general adult medical examination without abnormal findings: Secondary | ICD-10-CM | POA: Diagnosis not present

## 2023-05-13 DIAGNOSIS — E559 Vitamin D deficiency, unspecified: Secondary | ICD-10-CM | POA: Diagnosis not present

## 2023-05-13 DIAGNOSIS — F411 Generalized anxiety disorder: Secondary | ICD-10-CM | POA: Diagnosis not present

## 2023-05-13 DIAGNOSIS — Z23 Encounter for immunization: Secondary | ICD-10-CM | POA: Diagnosis not present

## 2023-05-13 DIAGNOSIS — Z6841 Body Mass Index (BMI) 40.0 and over, adult: Secondary | ICD-10-CM | POA: Diagnosis not present

## 2023-05-13 DIAGNOSIS — E785 Hyperlipidemia, unspecified: Secondary | ICD-10-CM | POA: Diagnosis not present

## 2023-05-13 DIAGNOSIS — Z1211 Encounter for screening for malignant neoplasm of colon: Secondary | ICD-10-CM | POA: Diagnosis not present

## 2023-05-13 DIAGNOSIS — K219 Gastro-esophageal reflux disease without esophagitis: Secondary | ICD-10-CM | POA: Diagnosis not present

## 2023-05-13 DIAGNOSIS — R7301 Impaired fasting glucose: Secondary | ICD-10-CM | POA: Diagnosis not present

## 2023-05-21 DIAGNOSIS — R946 Abnormal results of thyroid function studies: Secondary | ICD-10-CM | POA: Diagnosis not present

## 2023-05-29 DIAGNOSIS — Z79899 Other long term (current) drug therapy: Secondary | ICD-10-CM | POA: Diagnosis not present

## 2023-05-29 DIAGNOSIS — M797 Fibromyalgia: Secondary | ICD-10-CM | POA: Diagnosis not present

## 2023-05-29 DIAGNOSIS — Z9189 Other specified personal risk factors, not elsewhere classified: Secondary | ICD-10-CM | POA: Diagnosis not present

## 2023-05-29 DIAGNOSIS — E538 Deficiency of other specified B group vitamins: Secondary | ICD-10-CM | POA: Diagnosis not present

## 2023-05-29 DIAGNOSIS — E785 Hyperlipidemia, unspecified: Secondary | ICD-10-CM | POA: Diagnosis not present

## 2023-05-29 DIAGNOSIS — E8889 Other specified metabolic disorders: Secondary | ICD-10-CM | POA: Diagnosis not present

## 2023-05-29 DIAGNOSIS — Z1331 Encounter for screening for depression: Secondary | ICD-10-CM | POA: Diagnosis not present

## 2023-05-29 DIAGNOSIS — I10 Essential (primary) hypertension: Secondary | ICD-10-CM | POA: Diagnosis not present

## 2023-05-29 DIAGNOSIS — Z6841 Body Mass Index (BMI) 40.0 and over, adult: Secondary | ICD-10-CM | POA: Diagnosis not present

## 2023-05-29 DIAGNOSIS — F411 Generalized anxiety disorder: Secondary | ICD-10-CM | POA: Diagnosis not present

## 2023-05-29 DIAGNOSIS — K219 Gastro-esophageal reflux disease without esophagitis: Secondary | ICD-10-CM | POA: Diagnosis not present

## 2023-05-29 DIAGNOSIS — E66813 Obesity, class 3: Secondary | ICD-10-CM | POA: Diagnosis not present

## 2023-06-10 DIAGNOSIS — Z1331 Encounter for screening for depression: Secondary | ICD-10-CM | POA: Diagnosis not present

## 2023-06-10 DIAGNOSIS — Z6841 Body Mass Index (BMI) 40.0 and over, adult: Secondary | ICD-10-CM | POA: Diagnosis not present

## 2023-06-10 DIAGNOSIS — M545 Low back pain, unspecified: Secondary | ICD-10-CM | POA: Diagnosis not present

## 2023-06-10 DIAGNOSIS — R0683 Snoring: Secondary | ICD-10-CM | POA: Diagnosis not present

## 2023-06-10 DIAGNOSIS — E66813 Obesity, class 3: Secondary | ICD-10-CM | POA: Diagnosis not present

## 2023-06-10 DIAGNOSIS — E538 Deficiency of other specified B group vitamins: Secondary | ICD-10-CM | POA: Diagnosis not present

## 2023-07-16 DIAGNOSIS — H2513 Age-related nuclear cataract, bilateral: Secondary | ICD-10-CM | POA: Diagnosis not present

## 2023-07-16 DIAGNOSIS — H524 Presbyopia: Secondary | ICD-10-CM | POA: Diagnosis not present

## 2023-07-16 DIAGNOSIS — H02882 Meibomian gland dysfunction right lower eyelid: Secondary | ICD-10-CM | POA: Diagnosis not present

## 2023-07-16 DIAGNOSIS — H5203 Hypermetropia, bilateral: Secondary | ICD-10-CM | POA: Diagnosis not present

## 2023-07-16 DIAGNOSIS — H52223 Regular astigmatism, bilateral: Secondary | ICD-10-CM | POA: Diagnosis not present

## 2023-07-16 DIAGNOSIS — H02885 Meibomian gland dysfunction left lower eyelid: Secondary | ICD-10-CM | POA: Diagnosis not present

## 2023-09-05 DIAGNOSIS — R0683 Snoring: Secondary | ICD-10-CM | POA: Diagnosis not present

## 2023-09-05 DIAGNOSIS — R7303 Prediabetes: Secondary | ICD-10-CM | POA: Diagnosis not present

## 2023-09-05 DIAGNOSIS — E66813 Obesity, class 3: Secondary | ICD-10-CM | POA: Diagnosis not present

## 2023-09-05 DIAGNOSIS — Z6841 Body Mass Index (BMI) 40.0 and over, adult: Secondary | ICD-10-CM | POA: Diagnosis not present

## 2023-09-05 DIAGNOSIS — I10 Essential (primary) hypertension: Secondary | ICD-10-CM | POA: Diagnosis not present

## 2023-10-16 DIAGNOSIS — M9903 Segmental and somatic dysfunction of lumbar region: Secondary | ICD-10-CM | POA: Diagnosis not present

## 2023-10-16 DIAGNOSIS — M62830 Muscle spasm of back: Secondary | ICD-10-CM | POA: Diagnosis not present

## 2023-10-16 DIAGNOSIS — M62838 Other muscle spasm: Secondary | ICD-10-CM | POA: Diagnosis not present

## 2023-10-16 DIAGNOSIS — M542 Cervicalgia: Secondary | ICD-10-CM | POA: Diagnosis not present

## 2023-10-16 DIAGNOSIS — M9902 Segmental and somatic dysfunction of thoracic region: Secondary | ICD-10-CM | POA: Diagnosis not present

## 2023-10-16 DIAGNOSIS — M9901 Segmental and somatic dysfunction of cervical region: Secondary | ICD-10-CM | POA: Diagnosis not present

## 2023-10-16 DIAGNOSIS — M545 Low back pain, unspecified: Secondary | ICD-10-CM | POA: Diagnosis not present

## 2023-10-16 DIAGNOSIS — M546 Pain in thoracic spine: Secondary | ICD-10-CM | POA: Diagnosis not present

## 2023-10-17 DIAGNOSIS — M62838 Other muscle spasm: Secondary | ICD-10-CM | POA: Diagnosis not present

## 2023-10-17 DIAGNOSIS — M9901 Segmental and somatic dysfunction of cervical region: Secondary | ICD-10-CM | POA: Diagnosis not present

## 2023-10-17 DIAGNOSIS — M9902 Segmental and somatic dysfunction of thoracic region: Secondary | ICD-10-CM | POA: Diagnosis not present

## 2023-10-17 DIAGNOSIS — M9903 Segmental and somatic dysfunction of lumbar region: Secondary | ICD-10-CM | POA: Diagnosis not present

## 2023-10-17 DIAGNOSIS — M545 Low back pain, unspecified: Secondary | ICD-10-CM | POA: Diagnosis not present

## 2023-10-17 DIAGNOSIS — M542 Cervicalgia: Secondary | ICD-10-CM | POA: Diagnosis not present

## 2023-10-17 DIAGNOSIS — M546 Pain in thoracic spine: Secondary | ICD-10-CM | POA: Diagnosis not present

## 2023-10-17 DIAGNOSIS — M62830 Muscle spasm of back: Secondary | ICD-10-CM | POA: Diagnosis not present

## 2023-10-21 DIAGNOSIS — M9902 Segmental and somatic dysfunction of thoracic region: Secondary | ICD-10-CM | POA: Diagnosis not present

## 2023-10-21 DIAGNOSIS — M546 Pain in thoracic spine: Secondary | ICD-10-CM | POA: Diagnosis not present

## 2023-10-21 DIAGNOSIS — M62830 Muscle spasm of back: Secondary | ICD-10-CM | POA: Diagnosis not present

## 2023-10-21 DIAGNOSIS — M545 Low back pain, unspecified: Secondary | ICD-10-CM | POA: Diagnosis not present

## 2023-10-21 DIAGNOSIS — M542 Cervicalgia: Secondary | ICD-10-CM | POA: Diagnosis not present

## 2023-10-21 DIAGNOSIS — M9901 Segmental and somatic dysfunction of cervical region: Secondary | ICD-10-CM | POA: Diagnosis not present

## 2023-10-21 DIAGNOSIS — M62838 Other muscle spasm: Secondary | ICD-10-CM | POA: Diagnosis not present

## 2023-10-21 DIAGNOSIS — M9903 Segmental and somatic dysfunction of lumbar region: Secondary | ICD-10-CM | POA: Diagnosis not present

## 2023-10-23 DIAGNOSIS — M9902 Segmental and somatic dysfunction of thoracic region: Secondary | ICD-10-CM | POA: Diagnosis not present

## 2023-10-23 DIAGNOSIS — M62830 Muscle spasm of back: Secondary | ICD-10-CM | POA: Diagnosis not present

## 2023-10-23 DIAGNOSIS — M9901 Segmental and somatic dysfunction of cervical region: Secondary | ICD-10-CM | POA: Diagnosis not present

## 2023-10-23 DIAGNOSIS — M542 Cervicalgia: Secondary | ICD-10-CM | POA: Diagnosis not present

## 2023-10-23 DIAGNOSIS — M546 Pain in thoracic spine: Secondary | ICD-10-CM | POA: Diagnosis not present

## 2023-10-23 DIAGNOSIS — M9903 Segmental and somatic dysfunction of lumbar region: Secondary | ICD-10-CM | POA: Diagnosis not present

## 2023-10-23 DIAGNOSIS — M545 Low back pain, unspecified: Secondary | ICD-10-CM | POA: Diagnosis not present

## 2023-10-23 DIAGNOSIS — M62838 Other muscle spasm: Secondary | ICD-10-CM | POA: Diagnosis not present

## 2023-10-24 DIAGNOSIS — M62830 Muscle spasm of back: Secondary | ICD-10-CM | POA: Diagnosis not present

## 2023-10-24 DIAGNOSIS — M62838 Other muscle spasm: Secondary | ICD-10-CM | POA: Diagnosis not present

## 2023-10-24 DIAGNOSIS — M546 Pain in thoracic spine: Secondary | ICD-10-CM | POA: Diagnosis not present

## 2023-10-24 DIAGNOSIS — M542 Cervicalgia: Secondary | ICD-10-CM | POA: Diagnosis not present

## 2023-10-24 DIAGNOSIS — M545 Low back pain, unspecified: Secondary | ICD-10-CM | POA: Diagnosis not present

## 2023-10-24 DIAGNOSIS — M9902 Segmental and somatic dysfunction of thoracic region: Secondary | ICD-10-CM | POA: Diagnosis not present

## 2023-10-24 DIAGNOSIS — M9901 Segmental and somatic dysfunction of cervical region: Secondary | ICD-10-CM | POA: Diagnosis not present

## 2023-10-24 DIAGNOSIS — M9903 Segmental and somatic dysfunction of lumbar region: Secondary | ICD-10-CM | POA: Diagnosis not present

## 2023-10-28 DIAGNOSIS — M9903 Segmental and somatic dysfunction of lumbar region: Secondary | ICD-10-CM | POA: Diagnosis not present

## 2023-10-28 DIAGNOSIS — M542 Cervicalgia: Secondary | ICD-10-CM | POA: Diagnosis not present

## 2023-10-28 DIAGNOSIS — M9901 Segmental and somatic dysfunction of cervical region: Secondary | ICD-10-CM | POA: Diagnosis not present

## 2023-10-28 DIAGNOSIS — M62838 Other muscle spasm: Secondary | ICD-10-CM | POA: Diagnosis not present

## 2023-10-28 DIAGNOSIS — M62830 Muscle spasm of back: Secondary | ICD-10-CM | POA: Diagnosis not present

## 2023-10-28 DIAGNOSIS — M546 Pain in thoracic spine: Secondary | ICD-10-CM | POA: Diagnosis not present

## 2023-10-28 DIAGNOSIS — M545 Low back pain, unspecified: Secondary | ICD-10-CM | POA: Diagnosis not present

## 2023-10-28 DIAGNOSIS — M9902 Segmental and somatic dysfunction of thoracic region: Secondary | ICD-10-CM | POA: Diagnosis not present

## 2023-10-30 DIAGNOSIS — M62830 Muscle spasm of back: Secondary | ICD-10-CM | POA: Diagnosis not present

## 2023-10-30 DIAGNOSIS — M545 Low back pain, unspecified: Secondary | ICD-10-CM | POA: Diagnosis not present

## 2023-10-30 DIAGNOSIS — M9901 Segmental and somatic dysfunction of cervical region: Secondary | ICD-10-CM | POA: Diagnosis not present

## 2023-10-30 DIAGNOSIS — M9903 Segmental and somatic dysfunction of lumbar region: Secondary | ICD-10-CM | POA: Diagnosis not present

## 2023-10-30 DIAGNOSIS — M542 Cervicalgia: Secondary | ICD-10-CM | POA: Diagnosis not present

## 2023-10-30 DIAGNOSIS — M546 Pain in thoracic spine: Secondary | ICD-10-CM | POA: Diagnosis not present

## 2023-10-30 DIAGNOSIS — M9902 Segmental and somatic dysfunction of thoracic region: Secondary | ICD-10-CM | POA: Diagnosis not present

## 2023-10-30 DIAGNOSIS — M62838 Other muscle spasm: Secondary | ICD-10-CM | POA: Diagnosis not present

## 2023-10-31 DIAGNOSIS — M542 Cervicalgia: Secondary | ICD-10-CM | POA: Diagnosis not present

## 2023-10-31 DIAGNOSIS — M545 Low back pain, unspecified: Secondary | ICD-10-CM | POA: Diagnosis not present

## 2023-10-31 DIAGNOSIS — M9902 Segmental and somatic dysfunction of thoracic region: Secondary | ICD-10-CM | POA: Diagnosis not present

## 2023-10-31 DIAGNOSIS — M546 Pain in thoracic spine: Secondary | ICD-10-CM | POA: Diagnosis not present

## 2023-10-31 DIAGNOSIS — M62830 Muscle spasm of back: Secondary | ICD-10-CM | POA: Diagnosis not present

## 2023-10-31 DIAGNOSIS — M62838 Other muscle spasm: Secondary | ICD-10-CM | POA: Diagnosis not present

## 2023-10-31 DIAGNOSIS — M9903 Segmental and somatic dysfunction of lumbar region: Secondary | ICD-10-CM | POA: Diagnosis not present

## 2023-10-31 DIAGNOSIS — M9901 Segmental and somatic dysfunction of cervical region: Secondary | ICD-10-CM | POA: Diagnosis not present

## 2023-11-05 DIAGNOSIS — M9902 Segmental and somatic dysfunction of thoracic region: Secondary | ICD-10-CM | POA: Diagnosis not present

## 2023-11-05 DIAGNOSIS — M542 Cervicalgia: Secondary | ICD-10-CM | POA: Diagnosis not present

## 2023-11-05 DIAGNOSIS — M546 Pain in thoracic spine: Secondary | ICD-10-CM | POA: Diagnosis not present

## 2023-11-05 DIAGNOSIS — M545 Low back pain, unspecified: Secondary | ICD-10-CM | POA: Diagnosis not present

## 2023-11-05 DIAGNOSIS — M9903 Segmental and somatic dysfunction of lumbar region: Secondary | ICD-10-CM | POA: Diagnosis not present

## 2023-11-05 DIAGNOSIS — M9901 Segmental and somatic dysfunction of cervical region: Secondary | ICD-10-CM | POA: Diagnosis not present

## 2023-11-05 DIAGNOSIS — M62838 Other muscle spasm: Secondary | ICD-10-CM | POA: Diagnosis not present

## 2023-11-05 DIAGNOSIS — M62830 Muscle spasm of back: Secondary | ICD-10-CM | POA: Diagnosis not present

## 2023-11-06 DIAGNOSIS — M546 Pain in thoracic spine: Secondary | ICD-10-CM | POA: Diagnosis not present

## 2023-11-06 DIAGNOSIS — M9903 Segmental and somatic dysfunction of lumbar region: Secondary | ICD-10-CM | POA: Diagnosis not present

## 2023-11-06 DIAGNOSIS — M9902 Segmental and somatic dysfunction of thoracic region: Secondary | ICD-10-CM | POA: Diagnosis not present

## 2023-11-06 DIAGNOSIS — M9901 Segmental and somatic dysfunction of cervical region: Secondary | ICD-10-CM | POA: Diagnosis not present

## 2023-11-06 DIAGNOSIS — M545 Low back pain, unspecified: Secondary | ICD-10-CM | POA: Diagnosis not present

## 2023-11-06 DIAGNOSIS — M62830 Muscle spasm of back: Secondary | ICD-10-CM | POA: Diagnosis not present

## 2023-11-06 DIAGNOSIS — M542 Cervicalgia: Secondary | ICD-10-CM | POA: Diagnosis not present

## 2023-11-06 DIAGNOSIS — M62838 Other muscle spasm: Secondary | ICD-10-CM | POA: Diagnosis not present

## 2023-11-07 DIAGNOSIS — M545 Low back pain, unspecified: Secondary | ICD-10-CM | POA: Diagnosis not present

## 2023-11-07 DIAGNOSIS — M9902 Segmental and somatic dysfunction of thoracic region: Secondary | ICD-10-CM | POA: Diagnosis not present

## 2023-11-07 DIAGNOSIS — M62830 Muscle spasm of back: Secondary | ICD-10-CM | POA: Diagnosis not present

## 2023-11-07 DIAGNOSIS — M542 Cervicalgia: Secondary | ICD-10-CM | POA: Diagnosis not present

## 2023-11-07 DIAGNOSIS — M546 Pain in thoracic spine: Secondary | ICD-10-CM | POA: Diagnosis not present

## 2023-11-07 DIAGNOSIS — M62838 Other muscle spasm: Secondary | ICD-10-CM | POA: Diagnosis not present

## 2023-11-07 DIAGNOSIS — M9901 Segmental and somatic dysfunction of cervical region: Secondary | ICD-10-CM | POA: Diagnosis not present

## 2023-11-07 DIAGNOSIS — M9903 Segmental and somatic dysfunction of lumbar region: Secondary | ICD-10-CM | POA: Diagnosis not present

## 2023-11-08 DIAGNOSIS — L578 Other skin changes due to chronic exposure to nonionizing radiation: Secondary | ICD-10-CM | POA: Diagnosis not present

## 2023-11-08 DIAGNOSIS — D2272 Melanocytic nevi of left lower limb, including hip: Secondary | ICD-10-CM | POA: Diagnosis not present

## 2023-11-08 DIAGNOSIS — D485 Neoplasm of uncertain behavior of skin: Secondary | ICD-10-CM | POA: Diagnosis not present

## 2023-11-13 DIAGNOSIS — M9901 Segmental and somatic dysfunction of cervical region: Secondary | ICD-10-CM | POA: Diagnosis not present

## 2023-11-13 DIAGNOSIS — M542 Cervicalgia: Secondary | ICD-10-CM | POA: Diagnosis not present

## 2023-11-13 DIAGNOSIS — M62830 Muscle spasm of back: Secondary | ICD-10-CM | POA: Diagnosis not present

## 2023-11-13 DIAGNOSIS — M9903 Segmental and somatic dysfunction of lumbar region: Secondary | ICD-10-CM | POA: Diagnosis not present

## 2023-11-13 DIAGNOSIS — M9902 Segmental and somatic dysfunction of thoracic region: Secondary | ICD-10-CM | POA: Diagnosis not present

## 2023-11-13 DIAGNOSIS — M546 Pain in thoracic spine: Secondary | ICD-10-CM | POA: Diagnosis not present

## 2023-11-13 DIAGNOSIS — M62838 Other muscle spasm: Secondary | ICD-10-CM | POA: Diagnosis not present

## 2023-11-13 DIAGNOSIS — M545 Low back pain, unspecified: Secondary | ICD-10-CM | POA: Diagnosis not present

## 2023-11-15 DIAGNOSIS — R7301 Impaired fasting glucose: Secondary | ICD-10-CM | POA: Diagnosis not present

## 2023-11-21 DIAGNOSIS — M65311 Trigger thumb, right thumb: Secondary | ICD-10-CM | POA: Diagnosis not present

## 2023-12-07 DIAGNOSIS — H938X1 Other specified disorders of right ear: Secondary | ICD-10-CM | POA: Diagnosis not present

## 2023-12-07 DIAGNOSIS — Z8709 Personal history of other diseases of the respiratory system: Secondary | ICD-10-CM | POA: Diagnosis not present

## 2023-12-07 DIAGNOSIS — H6991 Unspecified Eustachian tube disorder, right ear: Secondary | ICD-10-CM | POA: Diagnosis not present

## 2024-04-02 DIAGNOSIS — M65311 Trigger thumb, right thumb: Secondary | ICD-10-CM | POA: Diagnosis not present

## 2024-05-25 DIAGNOSIS — R946 Abnormal results of thyroid function studies: Secondary | ICD-10-CM | POA: Diagnosis not present

## 2024-05-25 DIAGNOSIS — R7301 Impaired fasting glucose: Secondary | ICD-10-CM | POA: Diagnosis not present

## 2024-05-25 DIAGNOSIS — E785 Hyperlipidemia, unspecified: Secondary | ICD-10-CM | POA: Diagnosis not present

## 2024-05-25 DIAGNOSIS — E559 Vitamin D deficiency, unspecified: Secondary | ICD-10-CM | POA: Diagnosis not present

## 2024-05-25 DIAGNOSIS — I10 Essential (primary) hypertension: Secondary | ICD-10-CM | POA: Diagnosis not present

## 2024-05-27 DIAGNOSIS — R7301 Impaired fasting glucose: Secondary | ICD-10-CM | POA: Diagnosis not present

## 2024-05-27 DIAGNOSIS — F411 Generalized anxiety disorder: Secondary | ICD-10-CM | POA: Diagnosis not present

## 2024-05-27 DIAGNOSIS — K219 Gastro-esophageal reflux disease without esophagitis: Secondary | ICD-10-CM | POA: Diagnosis not present

## 2024-05-27 DIAGNOSIS — I10 Essential (primary) hypertension: Secondary | ICD-10-CM | POA: Diagnosis not present

## 2024-05-27 DIAGNOSIS — E559 Vitamin D deficiency, unspecified: Secondary | ICD-10-CM | POA: Diagnosis not present

## 2024-05-27 DIAGNOSIS — Z6841 Body Mass Index (BMI) 40.0 and over, adult: Secondary | ICD-10-CM | POA: Diagnosis not present

## 2024-05-27 DIAGNOSIS — Z1211 Encounter for screening for malignant neoplasm of colon: Secondary | ICD-10-CM | POA: Diagnosis not present

## 2024-05-27 DIAGNOSIS — R946 Abnormal results of thyroid function studies: Secondary | ICD-10-CM | POA: Diagnosis not present

## 2024-05-27 DIAGNOSIS — Z0000 Encounter for general adult medical examination without abnormal findings: Secondary | ICD-10-CM | POA: Diagnosis not present

## 2024-05-27 DIAGNOSIS — E785 Hyperlipidemia, unspecified: Secondary | ICD-10-CM | POA: Diagnosis not present
